# Patient Record
Sex: Female | Born: 1965 | Race: Black or African American | Hispanic: No | Marital: Married | Smoking: Never smoker
Health system: Southern US, Community
[De-identification: ages and names within clinical notes are randomized; demographics above are authoritative.]

## PROBLEM LIST (undated history)

## (undated) DIAGNOSIS — E039 Hypothyroidism, unspecified: Secondary | ICD-10-CM

## (undated) DIAGNOSIS — E559 Vitamin D deficiency, unspecified: Secondary | ICD-10-CM

## (undated) DIAGNOSIS — R51 Headache: Secondary | ICD-10-CM

## (undated) DIAGNOSIS — L80 Vitiligo: Secondary | ICD-10-CM

## (undated) HISTORY — PX: DILATION AND CURETTAGE OF UTERUS: SHX78

## (undated) HISTORY — DX: Vitamin D deficiency, unspecified: E55.9

## (undated) HISTORY — DX: Vitiligo: L80

---

## 2000-08-03 ENCOUNTER — Other Ambulatory Visit: Admission: RE | Admit: 2000-08-03 | Discharge: 2000-08-03 | Payer: Self-pay | Admitting: Obstetrics and Gynecology

## 2003-04-23 ENCOUNTER — Emergency Department (HOSPITAL_COMMUNITY): Admission: EM | Admit: 2003-04-23 | Discharge: 2003-04-23 | Payer: Self-pay | Admitting: Emergency Medicine

## 2004-11-03 ENCOUNTER — Ambulatory Visit (HOSPITAL_COMMUNITY): Admission: RE | Admit: 2004-11-03 | Discharge: 2004-11-03 | Payer: Self-pay | Admitting: Family Medicine

## 2006-03-27 ENCOUNTER — Ambulatory Visit (HOSPITAL_COMMUNITY): Admission: RE | Admit: 2006-03-27 | Discharge: 2006-03-27 | Payer: Self-pay | Admitting: Family Medicine

## 2007-05-01 ENCOUNTER — Ambulatory Visit (HOSPITAL_COMMUNITY): Admission: RE | Admit: 2007-05-01 | Discharge: 2007-05-01 | Payer: Self-pay | Admitting: Family Medicine

## 2009-05-04 ENCOUNTER — Ambulatory Visit (HOSPITAL_COMMUNITY): Admission: RE | Admit: 2009-05-04 | Discharge: 2009-05-04 | Payer: Self-pay | Admitting: Family Medicine

## 2010-05-25 ENCOUNTER — Other Ambulatory Visit: Payer: Self-pay | Admitting: Family Medicine

## 2010-05-25 DIAGNOSIS — Z139 Encounter for screening, unspecified: Secondary | ICD-10-CM

## 2010-06-08 ENCOUNTER — Ambulatory Visit (HOSPITAL_COMMUNITY)
Admission: RE | Admit: 2010-06-08 | Discharge: 2010-06-08 | Disposition: A | Discharge status: Home / Self Care | Payer: 59 | Source: Ambulatory Visit | Attending: Family Medicine | Admitting: Family Medicine

## 2010-06-08 DIAGNOSIS — Z1231 Encounter for screening mammogram for malignant neoplasm of breast: Secondary | ICD-10-CM | POA: Insufficient documentation

## 2010-06-08 DIAGNOSIS — Z139 Encounter for screening, unspecified: Secondary | ICD-10-CM

## 2010-08-13 ENCOUNTER — Telehealth (INDEPENDENT_AMBULATORY_CARE_PROVIDER_SITE_OTHER): Payer: Self-pay | Admitting: *Deleted

## 2010-08-13 NOTE — Telephone Encounter (Signed)
TCS resch'd to 10/06/10 @ 1:45 (12:45), movi pre instructions mailed

## 2010-09-15 ENCOUNTER — Encounter (INDEPENDENT_AMBULATORY_CARE_PROVIDER_SITE_OTHER): Payer: 59 | Admitting: Internal Medicine

## 2010-10-05 MED ORDER — SODIUM CHLORIDE 0.45 % IV SOLN
Freq: Once | INTRAVENOUS | Status: AC
  Administered 2010-10-06: 13:00:00 via INTRAVENOUS

## 2010-10-06 ENCOUNTER — Encounter (HOSPITAL_COMMUNITY): Payer: Self-pay | Admitting: *Deleted

## 2010-10-06 ENCOUNTER — Encounter (HOSPITAL_COMMUNITY)
Admission: RE | Disposition: A | Discharge status: Home / Self Care | Payer: Self-pay | Source: Ambulatory Visit | Attending: Internal Medicine

## 2010-10-06 ENCOUNTER — Ambulatory Visit (HOSPITAL_COMMUNITY)
Admission: RE | Admit: 2010-10-06 | Discharge: 2010-10-06 | Disposition: A | Discharge status: Home / Self Care | Payer: 59 | Source: Ambulatory Visit | Attending: Internal Medicine | Admitting: Internal Medicine

## 2010-10-06 ENCOUNTER — Other Ambulatory Visit (INDEPENDENT_AMBULATORY_CARE_PROVIDER_SITE_OTHER): Payer: Self-pay | Admitting: Internal Medicine

## 2010-10-06 DIAGNOSIS — Z800 Family history of malignant neoplasm of digestive organs: Secondary | ICD-10-CM

## 2010-10-06 DIAGNOSIS — D126 Benign neoplasm of colon, unspecified: Secondary | ICD-10-CM

## 2010-10-06 DIAGNOSIS — K644 Residual hemorrhoidal skin tags: Secondary | ICD-10-CM

## 2010-10-06 DIAGNOSIS — K648 Other hemorrhoids: Secondary | ICD-10-CM | POA: Insufficient documentation

## 2010-10-06 DIAGNOSIS — Z1211 Encounter for screening for malignant neoplasm of colon: Secondary | ICD-10-CM | POA: Insufficient documentation

## 2010-10-06 HISTORY — DX: Headache: R51

## 2010-10-06 HISTORY — DX: Hypothyroidism, unspecified: E03.9

## 2010-10-06 HISTORY — PX: COLONOSCOPY: SHX5424

## 2010-10-06 SURGERY — COLONOSCOPY
Anesthesia: Moderate Sedation

## 2010-10-06 MED ORDER — MEPERIDINE HCL 50 MG/ML IJ SOLN
INTRAMUSCULAR | Status: DC | PRN
  Administered 2010-10-06 (×2): 25 mg via INTRAVENOUS

## 2010-10-06 MED ORDER — MIDAZOLAM HCL 5 MG/5ML IJ SOLN
INTRAMUSCULAR | Status: AC
  Filled 2010-10-06: qty 10

## 2010-10-06 MED ORDER — MIDAZOLAM HCL 5 MG/5ML IJ SOLN
INTRAMUSCULAR | Status: DC | PRN
  Administered 2010-10-06: 2 mg via INTRAVENOUS
  Administered 2010-10-06: 1 mg via INTRAVENOUS
  Administered 2010-10-06: 2 mg via INTRAVENOUS

## 2010-10-06 MED ORDER — MEPERIDINE HCL 50 MG/ML IJ SOLN
INTRAMUSCULAR | Status: AC
  Filled 2010-10-06: qty 1

## 2010-10-06 NOTE — H&P (Signed)
Lisa Potter is an 45 y.o. female.   Chief Complaint: Patient is here for colonoscopy. HPI: [name] is 45 year old American female whose twin sister was diagnosed with metastatic colon carcinoma earlier this year. She is therefore here for high-risk screening colonoscopy. She denies abdominal pain rectal bleeding or change in her bowel habits. Family history is also significant for colon carcinoma and a maternal uncle who was in his 60,s at the time of diagnosis.  Past Medical History  Diagnosis Date  . Hypothyroidism   . Headache     Past Surgical History  Procedure Date  . Dilation and curettage of uterus     Family History  Problem Relation Age of Onset  . Colon cancer Sister    Social History:  reports that she has never smoked. She does not have any smokeless tobacco history on file. She reports that she does not drink alcohol or use illicit drugs.  Allergies: No Known Allergies  Medications Prior to Admission  Medication Dose Route Frequency Provider Last Rate Last Dose  . 0.45 % sodium chloride infusion   Intravenous Once Lisa Hippo, MD 20 mL/hr at 10/06/10 1259    . meperidine (DEMEROL) 50 MG/ML injection           . midazolam (VERSED) 5 MG/5ML injection            Medications Prior to Admission  Medication Sig Dispense Refill  . Calcium Carbonate-Vitamin Lisa (CALCIUM-VITAMIN Lisa) 500-200 MG-UNIT per tablet Take 1 tablet by mouth daily.        Marland Kitchen levothyroxine (SYNTHROID, LEVOTHROID) 25 MCG tablet Take 25 mcg by mouth daily.        . vitamin E 200 UNIT capsule Take 200 Units by mouth daily.        Marland Kitchen acetaminophen (TYLENOL) 500 MG tablet Take 500 mg by mouth every 6 (six) hours as needed. For pain       . calcium carbonate (OS-CAL) 600 MG TABS Take 600 mg by mouth daily.          No results found for this or any previous visit (from the past 48 hour(s)). No results found.  Review of Systems  Constitutional: Negative for weight loss.  Gastrointestinal: Negative for  abdominal pain, diarrhea, constipation, blood in stool and melena.    Blood pressure 110/69, pulse 78, temperature 98.7 F (37.1 C), temperature source Oral, resp. rate 18, height 5\' 3"  (1.6 m), weight 148 lb (67.132 kg), last menstrual period 10/05/2010, SpO2 100.00%. Physical Exam  Constitutional: She appears well-developed and well-nourished.  HENT:  Mouth/Throat: Oropharynx is clear and moist.  Eyes: Conjunctivae are normal. No scleral icterus.  Neck: No thyromegaly present.  Cardiovascular: Normal rate, regular rhythm and normal heart sounds.   No murmur heard. Respiratory: Effort normal and breath sounds normal.  GI: Soft. She exhibits no distension and no mass. There is no tenderness.  Musculoskeletal: She exhibits no edema.  Lymphadenopathy:    She has no cervical adenopathy.  Skin: Skin is warm and dry.     Assessment/Plan High-risk screening colonoscopy. Patient's twin sister was recently diagnosed with metastatic colon carcinoma.  Lisa Potter U 10/06/2010, 1:34 PM

## 2010-10-06 NOTE — Op Note (Signed)
COLONOSCOPY PROCEDURE REPORT  PATIENT:  Lisa Potter  MR#:  [id number] Birthdate:  Oct 05, 1965, 45 y.o., female Endoscopist:  Dr. Malissa Hippo, MD Referred By:  Dr. Lubertha South, MD Procedure Date: 10/06/2010  Procedure:   Colonoscopy with snare polypectomy  Indications:  Patient is 45 year old female who is undergoing high-risk screening colonoscopy. Her twin sister was recently diagnosed with metastatic colon carcinoma  Informed Consent:  Procedure and risks were reviewed with the patient and informed consent was obtained.  Medications:  Demerol 50 mg IV Versed 5 mg IV  Description of procedure:  After a digital rectal exam was performed, that colonoscope was advanced from the anus through the rectum and colon to the area of the cecum, ileocecal valve and appendiceal orifice. The cecum was deeply intubated. These structures were well-seen and photographed for the record. From the level of the cecum and ileocecal valve, the scope was slowly and cautiously withdrawn. The mucosal surfaces were carefully surveyed utilizing scope tip to flexion to facilitate fold flattening as needed. The scope was pulled down into the rectum where a thorough exam including retroflexion was performed. Terminal ileum was also examined.  Findings:   Prep excellent. Normal terminal ileum. 5-6 mm polyp snared from hepatic flexure. 2 tiny polyps at splenic flexure which were coagulated using snare tip. Small hemorrhoids below the dentate line.  Therapeutic/Diagnostic Maneuvers Performed:  See above  Complications:  None  Cecal Withdrawal Time:  14 minutes  Impression:  Normal terminal ileum. 5-6 mm polyp snared from hepatic flexure. 2 tiny polyps at splenic flexure were coagulated using snare tip. Small external hemorrhoids  Recommendations:  No aspirin for one week I will be contacting patient with results of biopsy. Next colonoscopy in 5 years  Lisa Potter  10/06/2010 2:11 PM  CC: Dr.  Harlow Asa, MD, MD & Dr. Bonnetta Barry ref. provider found

## 2010-10-13 ENCOUNTER — Encounter (INDEPENDENT_AMBULATORY_CARE_PROVIDER_SITE_OTHER): Payer: Self-pay | Admitting: *Deleted

## 2010-10-13 ENCOUNTER — Encounter (HOSPITAL_COMMUNITY): Payer: Self-pay | Admitting: Internal Medicine

## 2011-06-07 ENCOUNTER — Other Ambulatory Visit: Payer: Self-pay | Admitting: Family Medicine

## 2011-06-07 DIAGNOSIS — Z139 Encounter for screening, unspecified: Secondary | ICD-10-CM

## 2011-06-20 ENCOUNTER — Ambulatory Visit (HOSPITAL_COMMUNITY)
Admission: RE | Admit: 2011-06-20 | Discharge: 2011-06-20 | Disposition: A | Discharge status: Home / Self Care | Payer: 59 | Source: Ambulatory Visit | Attending: Family Medicine | Admitting: Family Medicine

## 2011-06-20 DIAGNOSIS — Z139 Encounter for screening, unspecified: Secondary | ICD-10-CM

## 2011-06-20 DIAGNOSIS — Z1231 Encounter for screening mammogram for malignant neoplasm of breast: Secondary | ICD-10-CM | POA: Insufficient documentation

## 2012-03-24 ENCOUNTER — Other Ambulatory Visit: Payer: Self-pay | Admitting: *Deleted

## 2012-03-24 MED ORDER — LEVOTHYROXINE SODIUM 50 MCG PO TABS: 50.0000 ug | ORAL_TABLET | Freq: Every day | ORAL | Status: DC

## 2012-04-18 ENCOUNTER — Other Ambulatory Visit: Payer: Self-pay | Admitting: Nurse Practitioner

## 2012-05-16 ENCOUNTER — Other Ambulatory Visit: Payer: Self-pay | Admitting: Nurse Practitioner

## 2012-06-15 ENCOUNTER — Other Ambulatory Visit: Payer: Self-pay

## 2012-06-15 MED ORDER — NORGESTIMATE-ETH ESTRADIOL 0.25-35 MG-MCG PO TABS: ORAL_TABLET | ORAL | Status: DC

## 2012-07-02 ENCOUNTER — Other Ambulatory Visit: Payer: Self-pay | Admitting: Family Medicine

## 2012-07-02 DIAGNOSIS — Z09 Encounter for follow-up examination after completed treatment for conditions other than malignant neoplasm: Secondary | ICD-10-CM

## 2012-07-13 ENCOUNTER — Encounter: Payer: Self-pay | Admitting: *Deleted

## 2012-07-16 ENCOUNTER — Ambulatory Visit (HOSPITAL_COMMUNITY): Payer: 59

## 2012-07-17 ENCOUNTER — Encounter: Payer: Self-pay | Admitting: Nurse Practitioner

## 2012-07-17 ENCOUNTER — Ambulatory Visit (HOSPITAL_COMMUNITY)
Admission: RE | Admit: 2012-07-17 | Discharge: 2012-07-17 | Disposition: A | Discharge status: Home / Self Care | Payer: 59 | Source: Ambulatory Visit | Attending: Family Medicine | Admitting: Family Medicine

## 2012-07-17 ENCOUNTER — Ambulatory Visit (INDEPENDENT_AMBULATORY_CARE_PROVIDER_SITE_OTHER): Payer: 59 | Admitting: Nurse Practitioner

## 2012-07-17 VITALS — BP 120/86 | Ht 63.25 in | Wt 158.8 lb

## 2012-07-17 DIAGNOSIS — B070 Plantar wart: Secondary | ICD-10-CM

## 2012-07-17 DIAGNOSIS — Z01419 Encounter for gynecological examination (general) (routine) without abnormal findings: Secondary | ICD-10-CM

## 2012-07-17 DIAGNOSIS — E039 Hypothyroidism, unspecified: Secondary | ICD-10-CM

## 2012-07-17 DIAGNOSIS — L408 Other psoriasis: Secondary | ICD-10-CM

## 2012-07-17 DIAGNOSIS — Z1231 Encounter for screening mammogram for malignant neoplasm of breast: Secondary | ICD-10-CM | POA: Insufficient documentation

## 2012-07-17 DIAGNOSIS — Z09 Encounter for follow-up examination after completed treatment for conditions other than malignant neoplasm: Secondary | ICD-10-CM

## 2012-07-17 DIAGNOSIS — N920 Excessive and frequent menstruation with regular cycle: Secondary | ICD-10-CM

## 2012-07-17 DIAGNOSIS — L409 Psoriasis, unspecified: Secondary | ICD-10-CM

## 2012-07-17 DIAGNOSIS — Z0000 Encounter for general adult medical examination without abnormal findings: Secondary | ICD-10-CM

## 2012-07-17 LAB — CBC WITH DIFFERENTIAL/PLATELET
Basophils Absolute: 0 10*3/uL (ref 0.0–0.1)
Basophils Relative: 0 % (ref 0–1)
Eosinophils Relative: 2 % (ref 0–5)
HCT: 42.1 % (ref 36.0–46.0)
MCHC: 34.4 g/dL (ref 30.0–36.0)
Monocytes Absolute: 0.4 10*3/uL (ref 0.1–1.0)
Neutro Abs: 5 10*3/uL (ref 1.7–7.7)
RDW: 13.4 % (ref 11.5–15.5)

## 2012-07-17 LAB — HEPATIC FUNCTION PANEL
AST: 31 U/L (ref 0–37)
Alkaline Phosphatase: 87 U/L (ref 39–117)
Bilirubin, Direct: 0.1 mg/dL (ref 0.0–0.3)
Indirect Bilirubin: 0.5 mg/dL (ref 0.0–0.9)
Total Bilirubin: 0.6 mg/dL (ref 0.3–1.2)

## 2012-07-17 LAB — LIPID PANEL
Cholesterol: 209 mg/dL — ABNORMAL HIGH (ref 0–200)
LDL Cholesterol: 116 mg/dL — ABNORMAL HIGH (ref 0–99)
Total CHOL/HDL Ratio: 3.1 Ratio
VLDL: 25 mg/dL (ref 0–40)

## 2012-07-17 LAB — BASIC METABOLIC PANEL
BUN: 10 mg/dL (ref 6–23)
Potassium: 4.3 mEq/L (ref 3.5–5.3)
Sodium: 139 mEq/L (ref 135–145)

## 2012-07-17 MED ORDER — NORGESTIMATE-ETH ESTRADIOL 0.25-35 MG-MCG PO TABS: ORAL_TABLET | ORAL | Status: DC

## 2012-07-17 NOTE — Progress Notes (Signed)
  Subjective:    Patient ID: Lisa Potter, female    DOB: 02-18-1965, 47 y.o.   MRN: [id number]  HPI  presents for her wellness physical. Married, same sexual partner. No pelvic pain. No discharge. Cycles are regular, slightly heavy. Continues to take birth control pills. Discussed potential adverse effects associated with OC use. Patient wishes to continue for now. Taking vitamin D 2000 units per day. Continues to have some psoriasis on her left palm wrist and nailbed. Has seen Dr. Margo Aye for this. The cream he prescribed is not working. Gets regular eye exams. Is due a dental exam. Scheduled for a mammogram today. Also complaints of sore area on the sole of her right foot that is been there for while. Very painful a time when she walks.    Review of Systems  Constitutional: Negative for fever, activity change, appetite change and fatigue.  HENT: Negative for hearing loss, sore throat, rhinorrhea, dental problem, postnasal drip, sinus pressure and ear discharge.   Eyes: Negative for visual disturbance.  Respiratory: Negative for cough, chest tightness, shortness of breath and wheezing.   Cardiovascular: Negative for chest pain.  Gastrointestinal: Negative for nausea, vomiting, abdominal pain, diarrhea, constipation, blood in stool and abdominal distention.  Genitourinary: Negative for dysuria, urgency, frequency, vaginal discharge, enuresis, difficulty urinating, menstrual problem and pelvic pain.  Skin: Positive for rash.  Neurological: Negative for headaches.  Psychiatric/Behavioral: Negative for sleep disturbance and dysphoric mood. The patient is not nervous/anxious.        Objective:   Physical Exam  Vitals reviewed. Constitutional: She is oriented to person, place, and time. She appears well-developed. No distress.  HENT:  Right Ear: External ear normal.  Left Ear: External ear normal.  Mouth/Throat: Oropharynx is clear and moist.  Neck: Normal range of motion. Neck supple. No  tracheal deviation present. No thyromegaly present.  Cardiovascular: Normal rate, regular rhythm and normal heart sounds.  Exam reveals no gallop.   No murmur heard. Pulmonary/Chest: Effort normal and breath sounds normal.  Abdominal: Soft. She exhibits no distension and no mass. There is no tenderness.  Genitourinary: Vagina normal and uterus normal. No vaginal discharge found.  Musculoskeletal: She exhibits no edema.  Lymphadenopathy:    She has no cervical adenopathy.  Neurological: She is alert and oriented to person, place, and time.  Skin: Skin is warm and dry. Rash noted.  Psychiatric: She has a normal mood and affect. Her behavior is normal.   Skin: Hypopigmentation and scaly dry skin noted along the thumb with disfiguration of the left thumb nailbed. A fading hyperpigmented areas noted on the left wrist. A small firm nodule noted in the sole of the right foot with a tiny pinpoint center noted. Breast exam no masses, axilla no adenopathy. External GU normal limit. Bimanual exam normal.        Assessment & Plan:  Well woman exam  Routine general medical examination at a health care facility - Plan: Basic metabolic panel, Hepatic function panel, Lipid panel, Vitamin D 25 hydroxy, Basic metabolic panel, Hepatic function panel, Lipid panel, Vitamin D 25 hydroxy  Hypothyroidism - Plan: TSH, TSH  Menorrhagia - Plan: CBC with Differential, CBC with Differential  Follow up - Plan: MM Digital Screening  Psoriasis  Plantar wart of right foot  Follow-up with dermatologist for psoriasis and plantar wart. Recommend dental exam. Continue calcium and vitamin D. Refill Sprintec x1 year. Again discussed risks associated with OC use. Next physical in one year.

## 2012-07-18 ENCOUNTER — Encounter: Payer: Self-pay | Admitting: Nurse Practitioner

## 2012-07-18 DIAGNOSIS — E039 Hypothyroidism, unspecified: Secondary | ICD-10-CM | POA: Insufficient documentation

## 2012-07-18 DIAGNOSIS — L409 Psoriasis, unspecified: Secondary | ICD-10-CM | POA: Insufficient documentation

## 2012-07-18 LAB — VITAMIN D 25 HYDROXY (VIT D DEFICIENCY, FRACTURES): Vit D, 25-Hydroxy: 57 ng/mL (ref 30–89)

## 2012-07-18 MED ORDER — LEVOTHYROXINE SODIUM 50 MCG PO TABS: 50.0000 ug | ORAL_TABLET | Freq: Every day | ORAL | Status: DC

## 2012-07-18 NOTE — Assessment & Plan Note (Signed)
TSH normal, continue same dose of levothyroxine

## 2012-07-18 NOTE — Assessment & Plan Note (Signed)
Follow up with dermatologist

## 2013-07-11 ENCOUNTER — Other Ambulatory Visit: Payer: Self-pay | Admitting: Nurse Practitioner

## 2013-07-18 ENCOUNTER — Other Ambulatory Visit: Payer: Self-pay | Admitting: Nurse Practitioner

## 2013-07-19 ENCOUNTER — Other Ambulatory Visit: Payer: Self-pay | Admitting: *Deleted

## 2013-08-09 ENCOUNTER — Ambulatory Visit (INDEPENDENT_AMBULATORY_CARE_PROVIDER_SITE_OTHER): Payer: BC Managed Care – PPO | Admitting: Nurse Practitioner

## 2013-08-09 ENCOUNTER — Encounter: Payer: Self-pay | Admitting: Nurse Practitioner

## 2013-08-09 VITALS — BP 128/88 | Ht 63.25 in | Wt 160.0 lb

## 2013-08-09 DIAGNOSIS — Z0000 Encounter for general adult medical examination without abnormal findings: Secondary | ICD-10-CM

## 2013-08-09 DIAGNOSIS — E039 Hypothyroidism, unspecified: Secondary | ICD-10-CM

## 2013-08-09 MED ORDER — LEVOTHYROXINE SODIUM 50 MCG PO TABS
ORAL_TABLET | ORAL | Status: DC
Start: 2013-08-09 — End: 2014-08-08

## 2013-08-09 MED ORDER — NORGESTIMATE-ETH ESTRADIOL 0.25-35 MG-MCG PO TABS: ORAL_TABLET | ORAL | Status: DC

## 2013-08-10 LAB — TSH: TSH: 4.185 u[IU]/mL (ref 0.350–4.500)

## 2013-08-12 ENCOUNTER — Encounter: Payer: Self-pay | Admitting: Nurse Practitioner

## 2013-08-12 NOTE — Progress Notes (Signed)
   Subjective:    Patient ID: ALISHBA NAPLES, female    DOB: 05-Sep-1965, 48 y.o.   MRN: [id number]  HPI Presents for her wellness exam. Married, same sexual partner. Regular vision and dental exams. Regular menstrual cycles, normal flow, start her cycle yesterday. Healthy diet. Stays active.    Review of Systems  Constitutional: Negative for activity change, appetite change and fatigue.  HENT: Negative for dental problem, ear pain, sinus pressure and sore throat.   Respiratory: Negative for cough, chest tightness, shortness of breath and wheezing.   Cardiovascular: Negative for chest pain.  Gastrointestinal: Negative for nausea, vomiting, abdominal pain, diarrhea, constipation, blood in stool and abdominal distention.  Genitourinary: Positive for vaginal bleeding. Negative for dysuria, urgency, frequency, vaginal discharge, difficulty urinating, genital sores, menstrual problem and pelvic pain.       Objective:   Physical Exam  Vitals reviewed. Constitutional: She is oriented to person, place, and time. She appears well-developed. No distress.  HENT:  Right Ear: External ear normal.  Left Ear: External ear normal.  Mouth/Throat: Oropharynx is clear and moist.  Neck: Normal range of motion. Neck supple. No tracheal deviation present. No thyromegaly present.  Cardiovascular: Normal rate, regular rhythm and normal heart sounds.  Exam reveals no gallop.   No murmur heard. Pulmonary/Chest: Effort normal and breath sounds normal.  Abdominal: Soft. She exhibits no distension. There is no tenderness.  Genitourinary: Vagina normal and uterus normal. No vaginal discharge found.  GU exam deferred due to patient's menses.  Musculoskeletal: She exhibits no edema.  Lymphadenopathy:    She has no cervical adenopathy.  Neurological: She is alert and oriented to person, place, and time.  Skin: Skin is warm and dry. No rash noted.  Psychiatric: She has a normal mood and affect. Her behavior is  normal.   Breast exam: Mild fine nodularity, no masses. Axilla no adenopathy.        Assessment & Plan:   Problem List Items Addressed This Visit     Endocrine   Hypothyroidism   Relevant Medications      levothyroxine (SYNTHROID, LEVOTHROID) tablet   Other Relevant Orders      TSH (Completed)    Other Visit Diagnoses   Routine general medical examination at a health care facility    -  Primary      Meds ordered this encounter  Medications  . calcipotriene (DOVONOX) 0.005 % cream    Sig: Apply topically 2 (two) times daily.  . calcipotriene-betamethasone (TACLONEX) external suspension    Sig: Apply topically daily.  . Calcium Carbonate (CALCI-CHEW PO)    Sig: Take by mouth.  . norgestimate-ethinyl estradiol (SPRINTEC 28) 0.25-35 MG-MCG tablet    Sig: TAKE ONE TABLET BY MOUTH ONCE DAILY AS DIRECTED    Dispense:  28 tablet    Refill:  11    Order Specific Question:  Supervising Provider    Answer:  Mikey Kirschner [[id number]]  . levothyroxine (SYNTHROID, LEVOTHROID) 50 MCG tablet    Sig: TAKE 1 TABLET BY MOUTH DAILY    Dispense:  30 tablet    Refill:  11    Order Specific Question:  Supervising Provider    Answer:  Mikey Kirschner [[id number]]   Discussed risk associated with OC use. Patient wishes to continue for now. Nonsmoker. Recommend daily calcium and vitamin D supplementation. Regular exercise and healthy diet. Return in about 1 year (around 08/10/2014).

## 2013-08-22 ENCOUNTER — Other Ambulatory Visit: Payer: Self-pay | Admitting: Nurse Practitioner

## 2013-08-22 DIAGNOSIS — Z139 Encounter for screening, unspecified: Secondary | ICD-10-CM

## 2013-09-16 ENCOUNTER — Ambulatory Visit (HOSPITAL_COMMUNITY)
Admission: RE | Admit: 2013-09-16 | Discharge: 2013-09-16 | Disposition: A | Discharge status: Home / Self Care | Payer: BC Managed Care – PPO | Source: Ambulatory Visit | Attending: Nurse Practitioner | Admitting: Nurse Practitioner

## 2013-09-16 DIAGNOSIS — Z1231 Encounter for screening mammogram for malignant neoplasm of breast: Secondary | ICD-10-CM | POA: Insufficient documentation

## 2013-09-16 DIAGNOSIS — Z139 Encounter for screening, unspecified: Secondary | ICD-10-CM

## 2014-08-08 ENCOUNTER — Other Ambulatory Visit: Payer: Self-pay | Admitting: Nurse Practitioner

## 2014-08-12 ENCOUNTER — Encounter: Payer: Self-pay | Admitting: Nurse Practitioner

## 2014-08-12 ENCOUNTER — Ambulatory Visit (INDEPENDENT_AMBULATORY_CARE_PROVIDER_SITE_OTHER): Payer: BLUE CROSS/BLUE SHIELD | Admitting: Nurse Practitioner

## 2014-08-12 VITALS — BP 130/80 | Ht 63.0 in | Wt 160.4 lb

## 2014-08-12 DIAGNOSIS — Z1151 Encounter for screening for human papillomavirus (HPV): Secondary | ICD-10-CM | POA: Diagnosis not present

## 2014-08-12 DIAGNOSIS — N912 Amenorrhea, unspecified: Secondary | ICD-10-CM

## 2014-08-12 DIAGNOSIS — Z124 Encounter for screening for malignant neoplasm of cervix: Secondary | ICD-10-CM | POA: Diagnosis not present

## 2014-08-12 DIAGNOSIS — Z0000 Encounter for general adult medical examination without abnormal findings: Secondary | ICD-10-CM

## 2014-08-12 DIAGNOSIS — E039 Hypothyroidism, unspecified: Secondary | ICD-10-CM

## 2014-08-12 DIAGNOSIS — L409 Psoriasis, unspecified: Secondary | ICD-10-CM | POA: Diagnosis not present

## 2014-08-12 MED ORDER — BETAMETHASONE DIPROPIONATE 0.05 % EX CREA: TOPICAL_CREAM | Freq: Two times a day (BID) | CUTANEOUS | Status: DC

## 2014-08-12 MED ORDER — CALCIPOTRIENE 0.005 % EX CREA: TOPICAL_CREAM | Freq: Two times a day (BID) | CUTANEOUS | Status: DC

## 2014-08-12 NOTE — Progress Notes (Signed)
Subjective:    Patient ID: Lisa Potter, female    DOB: 12/05/1965, 49 y.o.   MRN: [id number]  HPI presents for her wellness physical. Same sexual partner. Has been off her birth control for the past 2 months, no cycle. Regular vision and dental exams. Request refills on her topical medicines for her psoriasis. Regular exercise. Overall healthy diet. Has been working on her weight loss. Very active job. Will have her regular lab work done at work in November. Plans to schedule her own mammogram in September.    Review of Systems  Constitutional: Negative for activity change, appetite change and fatigue.  HENT: Negative for dental problem, ear pain, sinus pressure and sore throat.   Respiratory: Negative for cough, chest tightness, shortness of breath and wheezing.   Cardiovascular: Negative for chest pain.  Gastrointestinal: Negative for nausea, vomiting, abdominal pain, diarrhea, constipation and abdominal distention.  Genitourinary: Negative for dysuria, urgency, frequency, vaginal bleeding, vaginal discharge, enuresis, difficulty urinating, genital sores and pelvic pain.       Objective:   Physical Exam  Constitutional: She is oriented to person, place, and time. She appears well-developed. No distress.  HENT:  Right Ear: External ear normal.  Left Ear: External ear normal.  Mouth/Throat: Oropharynx is clear and moist.  Neck: Normal range of motion. Neck supple. No tracheal deviation present. No thyromegaly present.  Cardiovascular: Normal rate, regular rhythm and normal heart sounds.  Exam reveals no gallop.   No murmur heard. Pulmonary/Chest: Effort normal and breath sounds normal.  Abdominal: Soft. She exhibits no distension. There is no tenderness.  Genitourinary: Vagina normal and uterus normal. No vaginal discharge found.  External GU no rashes or lesions. Vagina no discharge. Cervix normal limit in appearance. No CMT. Bimanual exam no tenderness or obvious masses.    Musculoskeletal: She exhibits no edema.  Lymphadenopathy:    She has no cervical adenopathy.  Neurological: She is alert and oriented to person, place, and time.  Skin: Skin is warm and dry. No rash noted.  Psychiatric: She has a normal mood and affect. Her behavior is normal.  Vitals reviewed.  Breast exam: Scattered areas of density, no dominant masses. Axilla no adenopathy.        Assessment & Plan:   Problem List Items Addressed This Visit      Endocrine   Hypothyroidism   Relevant Orders   TSH     Musculoskeletal and Integument   Psoriasis    Other Visit Diagnoses    Routine general medical examination at a health care facility    -  Primary    Amenorrhea        Relevant Orders    FSH    Estradiol    Screening for cervical cancer        Relevant Orders    Pap IG and HPV (high risk) DNA detection    Screening for HPV (human papillomavirus)        Relevant Orders    Pap IG and HPV (high risk) DNA detection      Meds ordered this encounter  Medications  . betamethasone dipropionate (DIPROLENE) 0.05 % cream    Sig: Apply topically 2 (two) times daily. Prn up to 2 weeks at a time    Dispense:  45 g    Refill:  0    Order Specific Question:  Supervising Provider    Answer:  Mikey Kirschner [[id number]]  . calcipotriene (DOVONOX) 0.005 % cream  Sig: Apply topically 2 (two) times daily. Prn psoriasis; do not apply to face    Dispense:  60 g    Refill:  0    Order Specific Question:  Supervising Provider    Answer:  Mikey Kirschner [[id number]]   Further follow-up based on [facility] and estradiol levels. Continue daily vitamin D and calcium supplementation. Send copy of lab work to our office in November. Return in about 1 year (around 08/12/2015) for physical.

## 2014-08-14 LAB — PAP IG AND HPV HIGH-RISK
HPV, high-risk: NEGATIVE
PAP Smear Comment: 0

## 2014-08-18 ENCOUNTER — Other Ambulatory Visit: Payer: Self-pay

## 2014-08-18 LAB — ESTRADIOL, FREE
ESTRADIOL: 9.2 pg/mL
Free Estradiol, Percent: 1.2 %
Free Estradiol, Serum: 0.11 pg/mL

## 2014-08-18 LAB — TSH: TSH: 1.28 u[IU]/mL (ref 0.450–4.500)

## 2014-08-18 LAB — FOLLICLE STIMULATING HORMONE: FSH: 99.9 m[IU]/mL

## 2014-08-18 MED ORDER — LEVOTHYROXINE SODIUM 50 MCG PO TABS: 50.0000 ug | ORAL_TABLET | Freq: Every day | ORAL | Status: DC

## 2014-09-18 ENCOUNTER — Other Ambulatory Visit: Payer: Self-pay | Admitting: Nurse Practitioner

## 2014-09-18 DIAGNOSIS — Z1231 Encounter for screening mammogram for malignant neoplasm of breast: Secondary | ICD-10-CM

## 2014-10-13 ENCOUNTER — Ambulatory Visit (HOSPITAL_COMMUNITY)
Admission: RE | Admit: 2014-10-13 | Discharge: 2014-10-13 | Disposition: A | Discharge status: Home / Self Care | Payer: BLUE CROSS/BLUE SHIELD | Source: Ambulatory Visit | Attending: Nurse Practitioner | Admitting: Nurse Practitioner

## 2014-10-13 DIAGNOSIS — Z1231 Encounter for screening mammogram for malignant neoplasm of breast: Secondary | ICD-10-CM | POA: Insufficient documentation

## 2014-12-10 ENCOUNTER — Ambulatory Visit (INDEPENDENT_AMBULATORY_CARE_PROVIDER_SITE_OTHER): Payer: BLUE CROSS/BLUE SHIELD | Admitting: Nurse Practitioner

## 2014-12-10 ENCOUNTER — Encounter: Payer: Self-pay | Admitting: Nurse Practitioner

## 2014-12-10 VITALS — BP 122/82 | Ht 63.0 in | Wt 165.2 lb

## 2014-12-10 DIAGNOSIS — Z202 Contact with and (suspected) exposure to infections with a predominantly sexual mode of transmission: Secondary | ICD-10-CM | POA: Diagnosis not present

## 2014-12-10 DIAGNOSIS — R21 Rash and other nonspecific skin eruption: Secondary | ICD-10-CM

## 2014-12-10 MED ORDER — VALACYCLOVIR HCL 1 G PO TABS: 1000.0000 mg | ORAL_TABLET | Freq: Two times a day (BID) | ORAL | Status: DC

## 2014-12-11 ENCOUNTER — Encounter: Payer: Self-pay | Admitting: Nurse Practitioner

## 2014-12-11 NOTE — Progress Notes (Signed)
Subjective:  Presents for complaints of sudden onset rash in the right labial area that began 2 days ago. Slight pain in the area. External burning with urination. No fever. No urgency or frequency. No pelvic pain. Minimal vaginal discharge. Minimal odor. Has been married to the same partner for over 20 years. He is taking suppressive therapy for genital herpes which she was diagnosed with years ago. Patient has never had any outbreaks.  Objective:   BP 122/82 mmHg  Ht 5\' 3"  (1.6 m)  Wt 165 lb 3.2 oz (74.934 kg)  BMI 29.27 kg/m2  LMP 09/05/2013 NAD. Alert, oriented. Lungs clear. Heart regular rate rhythm. No pelvic tenderness on exam. External GU right labia is mildly edematous with a raised cluster of white confluent lesions on the mid to lower right labia. Several small white papules noted above and below this. Labia is minimally tender to palpation. No lesions are noted on the left side. Vagina very minimal white to clear mucoid discharge noted. Not even enough discharge for wet prep. Cervix normal limit in appearance. No CMT. Bimanual exam no tenderness or masses noted.  Assessment: Vulvar rash - Plan: Herpes simplex virus culture  Exposure to genital herpes - Plan: Herpes simplex virus culture  Plan:  Meds ordered this encounter  Medications  . valACYclovir (VALTREX) 1000 MG tablet    Sig: Take 1 tablet (1,000 mg total) by mouth 2 (two) times daily.    Dispense:  20 tablet    Refill:  0    Order Specific Question:  Supervising Provider    Answer:  Mikey Kirschner [[id number]]   Herpes culture pending. In the meantime start Valtrex as directed. Further follow-up based on culture results.

## 2014-12-13 LAB — HERPES SIMPLEX VIRUS CULTURE

## 2015-02-23 ENCOUNTER — Other Ambulatory Visit: Payer: Self-pay | Admitting: Family Medicine

## 2015-03-26 ENCOUNTER — Other Ambulatory Visit: Payer: Self-pay | Admitting: Family Medicine

## 2015-04-23 ENCOUNTER — Other Ambulatory Visit: Payer: Self-pay | Admitting: Family Medicine

## 2015-05-22 ENCOUNTER — Other Ambulatory Visit: Payer: Self-pay | Admitting: Family Medicine

## 2015-07-23 ENCOUNTER — Other Ambulatory Visit (INDEPENDENT_AMBULATORY_CARE_PROVIDER_SITE_OTHER): Payer: Self-pay | Admitting: *Deleted

## 2015-07-23 DIAGNOSIS — Z86010 Personal history of colonic polyps: Secondary | ICD-10-CM

## 2015-08-13 ENCOUNTER — Ambulatory Visit (INDEPENDENT_AMBULATORY_CARE_PROVIDER_SITE_OTHER): Payer: BLUE CROSS/BLUE SHIELD | Admitting: Nurse Practitioner

## 2015-08-13 ENCOUNTER — Encounter: Payer: Self-pay | Admitting: Nurse Practitioner

## 2015-08-13 VITALS — BP 126/80 | Ht 63.0 in | Wt 163.4 lb

## 2015-08-13 DIAGNOSIS — E559 Vitamin D deficiency, unspecified: Secondary | ICD-10-CM | POA: Insufficient documentation

## 2015-08-13 DIAGNOSIS — E039 Hypothyroidism, unspecified: Secondary | ICD-10-CM

## 2015-08-13 DIAGNOSIS — Z0000 Encounter for general adult medical examination without abnormal findings: Secondary | ICD-10-CM | POA: Diagnosis not present

## 2015-08-13 DIAGNOSIS — Z01419 Encounter for gynecological examination (general) (routine) without abnormal findings: Secondary | ICD-10-CM

## 2015-08-13 NOTE — Progress Notes (Signed)
   Subjective:    Patient ID: Lisa Potter, female    DOB: 03-Oct-1965, 50 y.o.   MRN: [id number]  HPI presents for her wellness exam. No bleeding or pelvic pain. Same sexual partner. Healthy diet. Regular vision and dental exams. Taking 2000 units of vitamin D daily. Is scheduled for repeat colonoscopy on 28-Oct-2022, her twin sister died of colon cancer 4 years ago. Regular walking for activity.    Review of Systems  Constitutional: Negative for activity change, appetite change, fatigue and fever.  HENT: Negative for dental problem, ear pain, sinus pressure and sore throat.   Respiratory: Negative for cough, chest tightness, shortness of breath and wheezing.   Cardiovascular: Negative for chest pain.  Gastrointestinal: Negative for abdominal distention, abdominal pain, constipation, diarrhea, nausea and vomiting.  Genitourinary: Negative for difficulty urinating, dysuria, enuresis, frequency, genital sores, pelvic pain, urgency, vaginal bleeding and vaginal discharge.       Objective:   Physical Exam  Constitutional: She is oriented to person, place, and time. She appears well-developed. No distress.  HENT:  Right Ear: External ear normal.  Left Ear: External ear normal.  Mouth/Throat: Oropharynx is clear and moist.  Neck: Normal range of motion. Neck supple. No tracheal deviation present. No thyromegaly present.  Cardiovascular: Normal rate, regular rhythm and normal heart sounds.  Exam reveals no gallop.   No murmur heard. Pulmonary/Chest: Effort normal and breath sounds normal.  Abdominal: Soft. She exhibits no distension. There is no tenderness.  Genitourinary: Vagina normal and uterus normal. No vaginal discharge found.  Genitourinary Comments: External GU: no rashes or lesions. Vagina: no discharge. Cervix normal in appearance; no CMT. Bimanual exam: no tenderness or obvious masses.   Musculoskeletal: She exhibits no edema.  Lymphadenopathy:    She has no cervical adenopathy.    Neurological: She is alert and oriented to person, place, and time.  Skin: Skin is warm and dry. No rash noted.  Psychiatric: She has a normal mood and affect. Her behavior is normal.  Vitals reviewed. Breast exam: areas of dense tissue; no masses; axillae no adenopathy.         Assessment & Plan:   Problem List Items Addressed This Visit      Endocrine   Hypothyroidism   Relevant Orders   TSH    Other Visit Diagnoses    Well woman exam    -  Primary   Relevant Orders   Lipid panel   Hepatic function panel   Basic metabolic panel   Vitamin D deficiency       Relevant Orders   VITAMIN D 25 Hydroxy (Vit-D Deficiency, Fractures)     Patient to schedule her own mammogram due in October.  Return in about 1 year (around 08/12/2016) for physical.

## 2015-08-14 LAB — HEPATIC FUNCTION PANEL
ALT: 19 IU/L (ref 0–32)
AST: 31 IU/L (ref 0–40)
Albumin: 4.6 g/dL (ref 3.5–5.5)
Alkaline Phosphatase: 113 IU/L (ref 39–117)
BILIRUBIN TOTAL: 0.5 mg/dL (ref 0.0–1.2)
Bilirubin, Direct: 0.12 mg/dL (ref 0.00–0.40)
TOTAL PROTEIN: 8.1 g/dL (ref 6.0–8.5)

## 2015-08-14 LAB — BASIC METABOLIC PANEL
BUN / CREAT RATIO: 11 (ref 9–23)
BUN: 10 mg/dL (ref 6–24)
CALCIUM: 9.6 mg/dL (ref 8.7–10.2)
CHLORIDE: 102 mmol/L (ref 96–106)
CO2: 25 mmol/L (ref 18–29)
Creatinine, Ser: 0.92 mg/dL (ref 0.57–1.00)
GFR calc Af Amer: 85 mL/min/{1.73_m2} (ref >59)
GFR, EST NON AFRICAN AMERICAN: 73 mL/min/{1.73_m2} (ref >59)
Glucose: 87 mg/dL (ref 65–99)
Potassium: 4 mmol/L (ref 3.5–5.2)
SODIUM: 143 mmol/L (ref 134–144)

## 2015-08-14 LAB — LIPID PANEL
Chol/HDL Ratio: 2.9 ratio units (ref 0.0–4.4)
Cholesterol, Total: 190 mg/dL (ref 100–199)
HDL: 66 mg/dL (ref >39)
LDL CALC: 108 mg/dL — AB (ref 0–99)
TRIGLYCERIDES: 79 mg/dL (ref 0–149)
VLDL Cholesterol Cal: 16 mg/dL (ref 5–40)

## 2015-08-14 LAB — TSH: TSH: 9.61 u[IU]/mL — ABNORMAL HIGH (ref 0.450–4.500)

## 2015-08-14 LAB — VITAMIN D 25 HYDROXY (VIT D DEFICIENCY, FRACTURES): Vit D, 25-Hydroxy: 37.5 ng/mL (ref 30.0–100.0)

## 2015-08-15 ENCOUNTER — Encounter: Payer: Self-pay | Admitting: Nurse Practitioner

## 2015-08-21 ENCOUNTER — Other Ambulatory Visit: Payer: Self-pay | Admitting: Family Medicine

## 2015-09-10 ENCOUNTER — Telehealth (INDEPENDENT_AMBULATORY_CARE_PROVIDER_SITE_OTHER): Payer: Self-pay | Admitting: *Deleted

## 2015-09-10 ENCOUNTER — Encounter (INDEPENDENT_AMBULATORY_CARE_PROVIDER_SITE_OTHER): Payer: Self-pay | Admitting: *Deleted

## 2015-09-10 NOTE — Telephone Encounter (Signed)
Referring MD/PCP: steve luking   Procedure: tcs  Reason/Indication:  Hx polyps, fam hx colon ca  Has patient had this procedure before?  Yes, 2012 -- epic  If so, when, by whom and where?    Is there a family history of colon cancer?  Yes, sister  Who?  What age when diagnosed?    Is patient diabetic?   no      Does patient have prosthetic heart valve or mechanical valve?  no  Do you have a pacemaker?  no  Has patient ever had endocarditis? no  Has patient had joint replacement within last 12 months?  no  Does patient tend to be constipated or take laxatives? no  Does patient have a history of alcohol/drug use?  no  Is patient on Coumadin, Plavix and/or Aspirin? no  Medications: see epic  Allergies: nkda  Medication Adjustment:   Procedure date & time: 10/07/15 at 100

## 2015-09-10 NOTE — Telephone Encounter (Signed)
Patient needs trilyte 

## 2015-09-11 MED ORDER — PEG 3350-KCL-NA BICARB-NACL 420 G PO SOLR: 4000.0000 mL | Freq: Once | ORAL | 0 refills | Status: AC

## 2015-09-11 NOTE — Telephone Encounter (Signed)
agree

## 2015-10-07 ENCOUNTER — Ambulatory Visit (HOSPITAL_COMMUNITY)
Admission: RE | Admit: 2015-10-07 | Discharge: 2015-10-07 | Disposition: A | Discharge status: Home / Self Care | Payer: BLUE CROSS/BLUE SHIELD | Source: Ambulatory Visit | Attending: Internal Medicine | Admitting: Internal Medicine

## 2015-10-07 ENCOUNTER — Encounter (HOSPITAL_COMMUNITY): Payer: Self-pay | Admitting: *Deleted

## 2015-10-07 ENCOUNTER — Encounter (HOSPITAL_COMMUNITY)
Admission: RE | Disposition: A | Discharge status: Home / Self Care | Payer: Self-pay | Source: Ambulatory Visit | Attending: Internal Medicine

## 2015-10-07 ENCOUNTER — Other Ambulatory Visit: Payer: Self-pay | Admitting: Nurse Practitioner

## 2015-10-07 DIAGNOSIS — Z8342 Family history of familial hypercholesterolemia: Secondary | ICD-10-CM | POA: Insufficient documentation

## 2015-10-07 DIAGNOSIS — Z09 Encounter for follow-up examination after completed treatment for conditions other than malignant neoplasm: Secondary | ICD-10-CM | POA: Diagnosis not present

## 2015-10-07 DIAGNOSIS — Z8249 Family history of ischemic heart disease and other diseases of the circulatory system: Secondary | ICD-10-CM | POA: Diagnosis not present

## 2015-10-07 DIAGNOSIS — K5730 Diverticulosis of large intestine without perforation or abscess without bleeding: Secondary | ICD-10-CM | POA: Insufficient documentation

## 2015-10-07 DIAGNOSIS — K6289 Other specified diseases of anus and rectum: Secondary | ICD-10-CM | POA: Insufficient documentation

## 2015-10-07 DIAGNOSIS — Z800 Family history of malignant neoplasm of digestive organs: Secondary | ICD-10-CM | POA: Insufficient documentation

## 2015-10-07 DIAGNOSIS — E039 Hypothyroidism, unspecified: Secondary | ICD-10-CM | POA: Diagnosis not present

## 2015-10-07 DIAGNOSIS — Z79899 Other long term (current) drug therapy: Secondary | ICD-10-CM | POA: Diagnosis not present

## 2015-10-07 DIAGNOSIS — Z1211 Encounter for screening for malignant neoplasm of colon: Secondary | ICD-10-CM | POA: Diagnosis present

## 2015-10-07 DIAGNOSIS — Z860100 Personal history of colon polyps, unspecified: Secondary | ICD-10-CM

## 2015-10-07 DIAGNOSIS — Z833 Family history of diabetes mellitus: Secondary | ICD-10-CM | POA: Insufficient documentation

## 2015-10-07 DIAGNOSIS — K644 Residual hemorrhoidal skin tags: Secondary | ICD-10-CM

## 2015-10-07 DIAGNOSIS — Z1231 Encounter for screening mammogram for malignant neoplasm of breast: Secondary | ICD-10-CM

## 2015-10-07 HISTORY — PX: COLONOSCOPY: SHX5424

## 2015-10-07 SURGERY — COLONOSCOPY
Anesthesia: Moderate Sedation

## 2015-10-07 MED ORDER — MIDAZOLAM HCL 5 MG/5ML IJ SOLN
INTRAMUSCULAR | Status: AC
  Filled 2015-10-07: qty 10

## 2015-10-07 MED ORDER — MIDAZOLAM HCL 5 MG/5ML IJ SOLN
INTRAMUSCULAR | Status: DC | PRN
  Administered 2015-10-07 (×2): 2 mg via INTRAVENOUS
  Administered 2015-10-07: 1 mg via INTRAVENOUS
  Administered 2015-10-07: 2 mg via INTRAVENOUS
  Administered 2015-10-07: 1 mg via INTRAVENOUS

## 2015-10-07 MED ORDER — MEPERIDINE HCL 50 MG/ML IJ SOLN
INTRAMUSCULAR | Status: AC
  Filled 2015-10-07: qty 1

## 2015-10-07 MED ORDER — MEPERIDINE HCL 50 MG/ML IJ SOLN
INTRAMUSCULAR | Status: DC | PRN
  Administered 2015-10-07: 50 mg via INTRAVENOUS
  Administered 2015-10-07: 25 mg via INTRAVENOUS

## 2015-10-07 MED ORDER — STERILE WATER FOR IRRIGATION IR SOLN
Status: DC | PRN
  Administered 2015-10-07: 14:00:00

## 2015-10-07 MED ORDER — SODIUM CHLORIDE 0.9 % IV SOLN
INTRAVENOUS | Status: DC
  Administered 2015-10-07: 12:00:00 via INTRAVENOUS

## 2015-10-07 NOTE — Op Note (Signed)
[facility] Patient Name: Lisa Potter Procedure Date: 10/07/2015 1:03 PM MRN: [id number] Date of Birth: [birth year] Attending MD: Hildred Laser , MD CSN: [id number] Age: 50 Admit Type: Outpatient Procedure:                Colonoscopy Indications:              High risk colon cancer surveillance: Personal                            history of colonic polyps, Family history of colon                            cancer in a first-degree relative Providers:                Hildred Laser, MD, Otis Peak B. Sharon Seller, RN, Randa Spike, Technician Referring MD:             Grace Bushy. Wolfgang Phoenix, MD Medicines:                Meperidine 50 mg IV, Midazolam 8 mg IV Complications:            No immediate complications. Estimated Blood Loss:     Estimated blood loss: none. Procedure:                Pre-Anesthesia Assessment:                           - Prior to the procedure, a History and Physical                            was performed, and patient medications and                            allergies were reviewed. The patient's tolerance of                            previous anesthesia was also reviewed. The risks                            and benefits of the procedure and the sedation                            options and risks were discussed with the patient.                            All questions were answered, and informed consent                            was obtained. Prior Anticoagulants: The patient has                            taken no previous anticoagulant or antiplatelet  agents. ASA Grade Assessment: II - A patient with                            mild systemic disease. After reviewing the risks                            and benefits, the patient was deemed in                            satisfactory condition to undergo the procedure.                           After obtaining informed consent, the colonoscope                             was passed under direct vision. Throughout the                            procedure, the patient's blood pressure, pulse, and                            oxygen saturations were monitored continuously. The                            EC-[id number]) scope was introduced through                            the anus and advanced to the the cecum, identified                            by appendiceal orifice and ileocecal valve. The                            colonoscopy was somewhat difficult due to                            significant looping. Successful completion of the                            procedure was aided by changing the patient to a                            supine position, using manual pressure and                            withdrawing and reinserting the scope. The patient                            tolerated the procedure well. The quality of the                            bowel preparation was good. The ileocecal valve,  appendiceal orifice, and rectum were photographed. Scope In: 1:46:10 PM Scope Out: 2:13:48 PM Total Procedure Duration: 0 hours 27 minutes 38 seconds  Findings:      A single small-mouthed diverticulum was found in the hepatic flexure.      A single medium-mouthed diverticulum was found in the splenic flexure.      The exam was otherwise without abnormality.      External hemorrhoids were found during retroflexion. The hemorrhoids       were small.      Anal papilla(e) were hypertrophied. Impression:               - Diverticulosis at the hepatic flexure.                           - Diverticulosis at the splenic flexure.                           - The examination was otherwise normal.                           - External hemorrhoids.                           - Anal papilla(e) were hypertrophied.                           - No specimens collected. Moderate Sedation:      Moderate (conscious) sedation was  administered by the endoscopy nurse       and supervised by the endoscopist. The following parameters were       monitored: oxygen saturation, heart rate, blood pressure, CO2       capnography and response to care. Total physician intraservice time was       32 minutes. Recommendation:           - Patient has a contact number available for                            emergencies. The signs and symptoms of potential                            delayed complications were discussed with the                            patient. Return to normal activities tomorrow.                            Written discharge instructions were provided to the                            patient.                           - Resume previous diet today.                           - Continue present medications.                           -  Repeat colonoscopy in 5 years for surveillance. Procedure Code(s):        --- Professional ---                           [phone], Colonoscopy, flexible; diagnostic, including                            collection of specimen(s) by brushing or washing,                            when performed (separate procedure)                           99152, Moderate sedation services provided by the                            same physician or other qualified health care                            professional performing the diagnostic or                            therapeutic service that the sedation supports,                            requiring the presence of an independent trained                            observer to assist in the monitoring of the                            patient's level of consciousness and physiological                            status; initial 15 minutes of intraservice time,                            patient age 56 years or older                           [phone], Moderate sedation services; each additional                            15 minutes intraservice  time Diagnosis Code(s):        --- Professional ---                           Z86.010, Personal history of colonic polyps                           K64.4, Residual hemorrhoidal skin tags                           K62.89, Other specified diseases of anus and rectum  Z80.0, Family history of malignant neoplasm of                            digestive organs                           K57.30, Diverticulosis of large intestine without                            perforation or abscess without bleeding CPT copyright 2016 American Medical Association. All rights reserved. The codes documented in this report are preliminary and upon coder review may  be revised to meet current compliance requirements. Hildred Laser, MD Hildred Laser, MD 10/07/2015 2:23:11 PM This report has been signed electronically. Number of Addenda: 0

## 2015-10-07 NOTE — Discharge Instructions (Signed)
Resume usual medications and high fiber diet. No driving for 24 hours. Next colonoscopy in 5 years.   Colonoscopy, Care After These instructions give you information on caring for yourself after your procedure. Your doctor may also give you more specific instructions. Call your doctor if you have any problems or questions after your procedure. HOME CARE  Do not drive for 24 hours.  Do not sign important papers or use machinery for 24 hours.  You may shower.  You may go back to your usual activities, but go slower for the first 24 hours.  Take rest breaks often during the first 24 hours.  Walk around or use warm packs on your belly (abdomen) if you have belly cramping or gas.  Drink enough fluids to keep your pee (urine) clear or pale yellow.  Resume your normal diet. Avoid heavy or fried foods.  Avoid drinking alcohol for 24 hours or as told by your doctor.  Only take medicines as told by your doctor. If a tissue sample (biopsy) was taken during the procedure:   Do not take aspirin or blood thinners for 7 days, or as told by your doctor.  Do not drink alcohol for 7 days, or as told by your doctor.  Eat soft foods for the first 24 hours. GET HELP IF: You still have a small amount of blood in your poop (stool) 2-3 days after the procedure. GET HELP RIGHT AWAY IF:  You have more than a small amount of blood in your poop.  You see clumps of tissue (blood clots) in your poop.  Your belly is puffy (swollen).  You feel sick to your stomach (nauseous) or throw up (vomit).  You have a fever.  You have belly pain that gets worse and medicine does not help. MAKE SURE YOU:  Understand these instructions.  Will watch your condition.  Will get help right away if you are not doing well or get worse.   This information is not intended to replace advice given to you by your health care provider. Make sure you discuss any questions you have with your health care provider.     Document Released: 01/22/2010 Document Revised: 12/25/2012 Document Reviewed: 08/27/2012 Elsevier Interactive Patient Education 2016 [employer].  Hemorrhoids Hemorrhoids are swollen veins around the rectum or anus. There are two types of hemorrhoids:   Internal hemorrhoids. These occur in the veins just inside the rectum. They may poke through to the outside and become irritated and painful.  External hemorrhoids. These occur in the veins outside the anus and can be felt as a painful swelling or hard lump near the anus. CAUSES  Pregnancy.   Obesity.   Constipation or diarrhea.   Straining to have a bowel movement.   Sitting for long periods on the toilet.  Heavy lifting or other activity that caused you to strain.  Anal intercourse. SYMPTOMS   Pain.   Anal itching or irritation.   Rectal bleeding.   Fecal leakage.   Anal swelling.   One or more lumps around the anus.  DIAGNOSIS  Your caregiver may be able to diagnose hemorrhoids by visual examination. Other examinations or tests that may be performed include:   Examination of the rectal area with a gloved hand (digital rectal exam).   Examination of anal canal using a small tube (scope).   A blood test if you have lost a significant amount of blood.  A test to look inside the colon (sigmoidoscopy or colonoscopy). TREATMENT Most  hemorrhoids can be treated at home. However, if symptoms do not seem to be getting better or if you have a lot of rectal bleeding, your caregiver may perform a procedure to help make the hemorrhoids get smaller or remove them completely. Possible treatments include:   Placing a rubber band at the base of the hemorrhoid to cut off the circulation (rubber band ligation).   Injecting a chemical to shrink the hemorrhoid (sclerotherapy).   Using a tool to burn the hemorrhoid (infrared light therapy).   Surgically removing the hemorrhoid (hemorrhoidectomy).   Stapling  the hemorrhoid to block blood flow to the tissue (hemorrhoid stapling).  HOME CARE INSTRUCTIONS   Eat foods with fiber, such as whole grains, beans, nuts, fruits, and vegetables. Ask your doctor about taking products with added fiber in them (fibersupplements).  Increase fluid intake. Drink enough water and fluids to keep your urine clear or pale yellow.   Exercise regularly.   Go to the bathroom when you have the urge to have a bowel movement. Do not wait.   Avoid straining to have bowel movements.   Keep the anal area dry and clean. Use wet toilet paper or moist towelettes after a bowel movement.   Medicated creams and suppositories may be used or applied as directed.   Only take over-the-counter or prescription medicines as directed by your caregiver.   Take warm sitz baths for 15-20 minutes, 3-4 times a day to ease pain and discomfort.   Place ice packs on the hemorrhoids if they are tender and swollen. Using ice packs between sitz baths may be helpful.   Put ice in a plastic bag.   Place a towel between your skin and the bag.   Leave the ice on for 15-20 minutes, 3-4 times a day.   Do not use a donut-shaped pillow or sit on the toilet for long periods. This increases blood pooling and pain.  SEEK MEDICAL CARE IF:  You have increasing pain and swelling that is not controlled by treatment or medicine.  You have uncontrolled bleeding.  You have difficulty or you are unable to have a bowel movement.  You have pain or inflammation outside the area of the hemorrhoids. MAKE SURE YOU:  Understand these instructions.  Will watch your condition.  Will get help right away if you are not doing well or get worse.   This information is not intended to replace advice given to you by your health care provider. Make sure you discuss any questions you have with your health care provider.   Document Released: 12/18/1999 Document Revised: 12/07/2011 Document Reviewed:  10/25/2011 Elsevier Interactive Patient Education 2016 [employer].    Diverticulosis Diverticulosis is the condition that develops when small pouches (diverticula) form in the wall of your colon. Your colon, or large intestine, is where water is absorbed and stool is formed. The pouches form when the inside layer of your colon pushes through weak spots in the outer layers of your colon. CAUSES  No one knows exactly what causes diverticulosis. RISK FACTORS  Being older than 44. Your risk for this condition increases with age. Diverticulosis is rare in people younger than 40 years. By age 30, almost everyone has it.  Eating a low-fiber diet.  Being frequently constipated.  Being overweight.  Not getting enough exercise.  Smoking.  Taking over-the-counter pain medicines, like aspirin and ibuprofen. SYMPTOMS  Most people with diverticulosis do not have symptoms. DIAGNOSIS  Because diverticulosis often has no symptoms, health care  providers often discover the condition during an exam for other colon problems. In many cases, a health care provider will diagnose diverticulosis while using a flexible scope to examine the colon (colonoscopy). TREATMENT  If you have never developed an infection related to diverticulosis, you may not need treatment. If you have had an infection before, treatment may include:  Eating more fruits, vegetables, and grains.  Taking a fiber supplement.  Taking a live bacteria supplement (probiotic).  Taking medicine to relax your colon. HOME CARE INSTRUCTIONS   Drink at least 6-8 glasses of water each day to prevent constipation.  Try not to strain when you have a bowel movement.  Keep all follow-up appointments. If you have had an infection before:  Increase the fiber in your diet as directed by your health care provider or dietitian.  Take a dietary fiber supplement if your health care provider approves.  Only take medicines as directed by  your health care provider. SEEK MEDICAL CARE IF:   You have abdominal pain.  You have bloating.  You have cramps.  You have not gone to the bathroom in 3 days. SEEK IMMEDIATE MEDICAL CARE IF:   Your pain gets worse.  Yourbloating becomes very bad.  You have a fever or chills, and your symptoms suddenly get worse.  You begin vomiting.  You have bowel movements that are bloody or black. MAKE SURE YOU:  Understand these instructions.  Will watch your condition.  Will get help right away if you are not doing well or get worse.   This information is not intended to replace advice given to you by your health care provider. Make sure you discuss any questions you have with your health care provider.   Document Released: 09/17/2003 Document Revised: 12/25/2012 Document Reviewed: 11/14/2012 Elsevier Interactive Patient Education [employer].

## 2015-10-07 NOTE — H&P (Signed)
Lisa Potter is an 50 y.o. female.   Chief Complaint: Patient is here for colonoscopy. HPI: Patient is 50 year old female who is history of colonic polyp and is here for surveillance colonoscopy. She denies abdominal pain change in bowel habits or rectal bleeding. Last colonoscopy was 5 years ago with removal of small polyp and was sessile serrated polyp. 2 small polyps coagulator. Family history significant for CRC in her twin sister at age 15. She died of metastatic disease at age 41.   Past Medical History:  Diagnosis Date  . Headache(784.0)   . Hypothyroidism   . Vitamin D deficiency   . Vitiligo     Past Surgical History:  Procedure Laterality Date  . COLONOSCOPY  10/06/2010   Procedure: COLONOSCOPY;  Surgeon: Rogene Houston, MD;  Location: [facility];  Service: Endoscopy;  Laterality: N/A;  . DILATION AND CURETTAGE OF UTERUS      Family History  Problem Relation Age of Onset  . Colon cancer Sister   . Hypothyroidism Sister   . Hypertension Mother   . Diabetes Mother    Social History:  reports that she has never smoked. She has never used smokeless tobacco. She reports that she does not drink alcohol or use drugs.  Allergies: No Known Allergies  Medications Prior to Admission  Medication Sig Dispense Refill  . betamethasone dipropionate (DIPROLENE) 0.05 % cream Apply topically 2 (two) times daily. Prn up to 2 weeks at a time 45 g 0  . BLACK COHOSH EXTRACT PO Take by mouth.    . calcipotriene (DOVONOX) 0.005 % cream Apply topically 2 (two) times daily. Prn psoriasis; do not apply to face 60 g 0  . Calcium Carbonate (CALCI-CHEW PO) Take by mouth.    . clobetasol cream (TEMOVATE) [id number] % Apply 1 application topically 2 (two) times daily.    Marland Kitchen levothyroxine (SYNTHROID, LEVOTHROID) 50 MCG tablet TAKE 1 TABLET(50 MCG) BY MOUTH DAILY 30 tablet 5  . OVER THE COUNTER MEDICATION Vit d 2,000 iu    . levothyroxine (SYNTHROID, LEVOTHROID) 50 MCG tablet TAKE 1 TABLET BY MOUTH EVERY  DAY 30 tablet 0  . valACYclovir (VALTREX) 1000 MG tablet Take 1 tablet (1,000 mg total) by mouth 2 (two) times daily. (Patient not taking: Reported on 10/07/2015) 20 tablet 0    No results found for this or any previous visit (from the past 48 hour(s)). No results found.  ROS  Blood pressure (!) 141/72, pulse 67, temperature 98.3 F (36.8 C), temperature source Oral, resp. rate 10, height 5\' 3"  (1.6 m), weight 160 lb (72.6 kg), last menstrual period 09/05/2013, SpO2 100 %. Physical Exam  Constitutional: She appears well-developed and well-nourished.  HENT:  Mouth/Throat: Oropharynx is clear and moist.  Eyes: Conjunctivae are normal. No scleral icterus.  Neck: No thyromegaly present.  Cardiovascular: Normal rate, regular rhythm and normal heart sounds.   No murmur heard. Respiratory: Effort normal and breath sounds normal.  GI: Soft. She exhibits no distension and no mass. There is no tenderness.  Musculoskeletal: She exhibits no edema.  Lymphadenopathy:    She has no cervical adenopathy.  Neurological: She is alert.  Skin: Skin is warm and dry.     Assessment/Plan History of sessile serrated polyp. Family history of CRC in a sister at age 16. Surveillance colonoscopy.  Hildred Laser, MD 10/07/2015, 1:35 PM

## 2015-10-13 ENCOUNTER — Encounter (HOSPITAL_COMMUNITY): Payer: Self-pay | Admitting: Internal Medicine

## 2015-10-14 ENCOUNTER — Encounter (INDEPENDENT_AMBULATORY_CARE_PROVIDER_SITE_OTHER): Payer: Self-pay | Admitting: *Deleted

## 2015-10-19 ENCOUNTER — Ambulatory Visit (HOSPITAL_COMMUNITY)
Admission: RE | Admit: 2015-10-19 | Discharge: 2015-10-19 | Disposition: A | Discharge status: Home / Self Care | Payer: BLUE CROSS/BLUE SHIELD | Source: Ambulatory Visit | Attending: Nurse Practitioner | Admitting: Nurse Practitioner

## 2015-10-19 DIAGNOSIS — Z1231 Encounter for screening mammogram for malignant neoplasm of breast: Secondary | ICD-10-CM | POA: Diagnosis not present

## 2016-02-24 ENCOUNTER — Other Ambulatory Visit: Payer: Self-pay | Admitting: Family Medicine

## 2016-02-24 DIAGNOSIS — E039 Hypothyroidism, unspecified: Secondary | ICD-10-CM

## 2016-02-24 NOTE — Telephone Encounter (Signed)
One mo worth, call pt nd have her do tsh

## 2016-02-25 NOTE — Telephone Encounter (Signed)
Left message return call 02/25/2016

## 2016-03-18 LAB — TSH: TSH: 7.74 u[IU]/mL — ABNORMAL HIGH (ref 0.450–4.500)

## 2016-03-25 ENCOUNTER — Other Ambulatory Visit: Payer: Self-pay | Admitting: Family Medicine

## 2016-03-28 MED ORDER — LEVOTHYROXINE SODIUM 75 MCG PO TABS: ORAL_TABLET | ORAL | 3 refills | Status: DC

## 2016-06-12 LAB — TSH: TSH: 0.159 u[IU]/mL — ABNORMAL LOW (ref 0.450–4.500)

## 2016-09-21 ENCOUNTER — Other Ambulatory Visit: Payer: Self-pay | Admitting: Nurse Practitioner

## 2016-09-21 DIAGNOSIS — Z1231 Encounter for screening mammogram for malignant neoplasm of breast: Secondary | ICD-10-CM

## 2016-10-12 ENCOUNTER — Encounter: Payer: Self-pay | Admitting: Nurse Practitioner

## 2016-10-12 ENCOUNTER — Ambulatory Visit (INDEPENDENT_AMBULATORY_CARE_PROVIDER_SITE_OTHER): Payer: BLUE CROSS/BLUE SHIELD | Admitting: Nurse Practitioner

## 2016-10-12 VITALS — BP 122/84 | Ht 63.0 in | Wt 172.6 lb

## 2016-10-12 DIAGNOSIS — Z780 Asymptomatic menopausal state: Secondary | ICD-10-CM

## 2016-10-12 DIAGNOSIS — Z0000 Encounter for general adult medical examination without abnormal findings: Secondary | ICD-10-CM

## 2016-10-12 DIAGNOSIS — E039 Hypothyroidism, unspecified: Secondary | ICD-10-CM | POA: Diagnosis not present

## 2016-10-12 MED ORDER — GABAPENTIN 100 MG PO CAPS: 100.0000 mg | ORAL_CAPSULE | Freq: Every day | ORAL | 2 refills | Status: DC

## 2016-10-12 NOTE — Patient Instructions (Addendum)
Levothyroxine Dose- Take a whole pill Sunday, Monday, Tuesday, Thursday, Friday and take 1/2 pill every Wednesday and Saturday. We will recheck your thyroid levels in January.  Gabapentin 100mg  take this medication at bedtime for about 2-3 weeks and see if it helps with your menopause symptoms. If your symptoms are not improved, let us know and we can discuss other options.

## 2016-10-12 NOTE — Progress Notes (Signed)
Subjective:    Patient ID: Lisa Potter, female    DOB: 10/14/1965, 51 y.o.   MRN: [id number]  HPI: 51 y/o female presents today for annual wellness exam. Pt reports overall good health. Denies tobacco or alcohol use. Pt reports good dietary habits and regular physical activity- walking 2 miles 3-5x/wk. Pt denies any depressed thoughts or suicidal ideation. Reports last colonoscopy was one year ago- results normal. Pt states her twin sister died of colon cancer. Pt reports annual eye exams and regular dental visits. Reports annual mammograms next one is scheduled this month- denies any concerns. Pt states her flu vaccine and routine lab work are provided through work- copies of lab work requested for our records. Pt reports her psoriasis is stable. Pt reports weight gain in recent months with no change in diet or activity. Pt stated she changed the dosage of levothyroxine 93mcg from 1 tab a day to 1/2 tab every three days in April due to a misunderstanding about her dosage. She denies changes to her mood, appetite, skin, hair, or energy levels. Pt reports she has been experiencing hot flashes 10-12 times per day for appx the last two years. She reports the symptoms waking her from her sleep and affecting her while she is at work. She has tried over the counter estrovan, black cohosh, and soy without any changes to symptoms. She requests a prescription medication to help with these symptoms.    Review of Systems  Constitutional: Negative for activity change, appetite change and fatigue.  HENT: Negative for dental problem, ear pain, sinus pressure and sore throat.   Respiratory: Negative for cough, chest tightness, shortness of breath and wheezing.   Cardiovascular: Negative for chest pain.  Gastrointestinal: Negative for abdominal distention, abdominal pain, constipation, diarrhea, nausea and vomiting.  Genitourinary: Negative for difficulty urinating, dysuria, enuresis, frequency, genital sores,  pelvic pain, urgency, vaginal bleeding and vaginal discharge.  Psychiatric/Behavioral: Positive for sleep disturbance.   Depression screen PHQ 2/9 10/12/2016  Decreased Interest 0  Down, Depressed, Hopeless 0  PHQ - 2 Score 0      Objective:   Physical Exam  Constitutional: She is oriented to person, place, and time. She appears well-developed. No distress.  HENT:  Right Ear: External ear normal.  Left Ear: External ear normal.  Mouth/Throat: Oropharynx is clear and moist.  Eyes: Pupils are equal, round, and reactive to light.  Neck: Normal range of motion. Neck supple. No tracheal deviation present. No thyromegaly present.  Cardiovascular: Normal rate, regular rhythm and normal heart sounds.  Exam reveals no gallop.   No murmur heard. Pulmonary/Chest: Effort normal and breath sounds normal.  Breasts: breasts appear normal, no suspicious masses, no skin or nipple changes or axillary nodes.  Abdominal: Soft. She exhibits no distension. There is no tenderness.  Genitourinary: Vagina normal and uterus normal.  Genitourinary Comments: External genitalia intact with no lesions, discharge, erythema, or tenderness. Vagina no discharge. Cervix normal limit in appearance. No CMT. Bimanual exam no tenderness or obvious masses.   Musculoskeletal: Normal range of motion. She exhibits no edema.  Lymphadenopathy:    She has no cervical adenopathy.  Neurological: She is alert and oriented to person, place, and time.  Skin: Skin is warm and dry. No rash noted.  Psychiatric: She has a normal mood and affect. Her behavior is normal.  Vitals reviewed.  Vitals:   10/12/16 0816  BP: 122/84  Weight: 172 lb 9.6 oz (78.3 kg)  Height: 5\' 3"  (1.6 m)  Assessment & Plan:  1. Routine general medical examination at a health care facility  2. Hypothyroidism, unspecified type - Instructed to take levothyroxine 53mcg 1 tab every Sunday, Monday, Tuesday, Thursday, and Friday and 1/2 tab every  Wednesday and Saturday - TSH recheck in January, 2019  3. Menopause - Gabapentin 100mg  1 tab qHS for menopause symptoms.   . Meds ordered this encounter  Medications  . gabapentin (NEURONTIN) 100 MG capsule    Sig: Take 1 capsule (100 mg total) by mouth at bedtime.    Dispense:  30 capsule    Refill:  2    Order Specific Question:   Supervising Provider    Answer:   Mikey Kirschner [[id number]]  call back in 4-6 weeks if no improvement in menopausal symptoms, sooner if any problems with new medication.  Return in about 1 year (around 10/12/2017).

## 2016-10-14 ENCOUNTER — Telehealth: Payer: Self-pay | Admitting: Nurse Practitioner

## 2016-10-14 ENCOUNTER — Encounter: Payer: Self-pay | Admitting: Nurse Practitioner

## 2016-10-14 MED ORDER — CLOBETASOL PROPIONATE 0.05 % EX CREA: 1.0000 "application " | TOPICAL_CREAM | Freq: Two times a day (BID) | CUTANEOUS | 5 refills | Status: DC

## 2016-10-14 NOTE — Telephone Encounter (Signed)
Resubmitted to the pt pharmacy.

## 2016-10-14 NOTE — Telephone Encounter (Signed)
We refilled the clobetasol cream for her psoriasis that day as she requested. Did she want something else?

## 2016-10-14 NOTE — Telephone Encounter (Signed)
Pt says we didn't order her a Rx for her psoriasis  Please advise  [facility]/[place]

## 2016-10-20 ENCOUNTER — Ambulatory Visit (HOSPITAL_COMMUNITY)
Admission: RE | Admit: 2016-10-20 | Discharge: 2016-10-20 | Disposition: A | Discharge status: Home / Self Care | Payer: BLUE CROSS/BLUE SHIELD | Source: Ambulatory Visit | Attending: Nurse Practitioner | Admitting: Nurse Practitioner

## 2016-10-20 DIAGNOSIS — Z1231 Encounter for screening mammogram for malignant neoplasm of breast: Secondary | ICD-10-CM

## 2016-10-31 ENCOUNTER — Other Ambulatory Visit: Payer: Self-pay | Admitting: Family Medicine

## 2017-01-14 DIAGNOSIS — E039 Hypothyroidism, unspecified: Secondary | ICD-10-CM | POA: Diagnosis not present

## 2017-01-15 LAB — TSH: TSH: 1.64 u[IU]/mL (ref 0.450–4.500)

## 2017-01-17 ENCOUNTER — Other Ambulatory Visit: Payer: Self-pay | Admitting: Nurse Practitioner

## 2017-05-19 ENCOUNTER — Other Ambulatory Visit: Payer: Self-pay | Admitting: Family Medicine

## 2017-06-02 DIAGNOSIS — M1000 Idiopathic gout, unspecified site: Secondary | ICD-10-CM | POA: Diagnosis not present

## 2017-06-05 ENCOUNTER — Encounter: Payer: Self-pay | Admitting: Nurse Practitioner

## 2017-06-05 ENCOUNTER — Ambulatory Visit: Payer: BLUE CROSS/BLUE SHIELD | Admitting: Nurse Practitioner

## 2017-06-05 ENCOUNTER — Ambulatory Visit (HOSPITAL_COMMUNITY)
Admission: RE | Admit: 2017-06-05 | Discharge: 2017-06-05 | Disposition: A | Discharge status: Home / Self Care | Payer: BLUE CROSS/BLUE SHIELD | Source: Ambulatory Visit | Attending: Nurse Practitioner | Admitting: Nurse Practitioner

## 2017-06-05 VITALS — BP 154/88 | Temp 98.2°F | Ht 63.0 in | Wt 177.0 lb

## 2017-06-05 DIAGNOSIS — M25572 Pain in left ankle and joints of left foot: Secondary | ICD-10-CM | POA: Diagnosis not present

## 2017-06-05 DIAGNOSIS — M7989 Other specified soft tissue disorders: Secondary | ICD-10-CM | POA: Insufficient documentation

## 2017-06-05 MED ORDER — NAPROXEN 500 MG PO TABS: 500.0000 mg | ORAL_TABLET | Freq: Two times a day (BID) | ORAL | 0 refills | Status: DC

## 2017-06-06 ENCOUNTER — Encounter: Payer: Self-pay | Admitting: Nurse Practitioner

## 2017-06-06 LAB — URIC ACID: Uric Acid: 4.6 mg/dL (ref 2.5–7.1)

## 2017-06-06 LAB — SEDIMENTATION RATE: Sed Rate: 96 mm/hr — ABNORMAL HIGH (ref 0–40)

## 2017-06-06 NOTE — Progress Notes (Signed)
Subjective: Presents for complaints of swelling and pain in the left ankle area that began on 5/25.  Was extremely swollen at one point.  Worse with prolonged walking.  No history of injury.  Is wondering whether it could be an insect bite but has no history of any stings or bites.  No fever.  Went to urgent care on 531, no labs or x-rays were done at that time.  She states she was told they can treat her for gout with medication but patient was reluctant to do this due to potential side effects and no verification of the diagnosis.  Has no previous history of gout.  Does have psoriasis, has had a flareup on her left hand recently.  No previous joint pain.  Objective:   BP (!) 154/88   Temp 98.2 F (36.8 C) (Oral)   Ht 5\' 3"  (1.6 m)   Wt 177 lb (80.3 kg)   LMP 09/05/2013   BMI 31.35 kg/m  NAD.  Alert, oriented.  Lungs clear.  Heart regular rate rhythm.  The lateral right ankle at the malleolus is faintly pink in color mild edema with localized significant tenderness around the malleolus.  Normal ROM of the left ankle with no tenderness noted.  No joint laxity.  Assessment:  Left lateral ankle pain - Plan: DG Ankle Complete Left, Uric acid, Sedimentation rate    Plan:   Meds ordered this encounter  Medications  . naproxen (NAPROSYN) 500 MG tablet    Sig: Take 1 tablet (500 mg total) by mouth 2 (two) times daily with a meal. Prn pain    Dispense:  30 tablet    Refill:  0    Order Specific Question:   Supervising Provider    Answer:   Maggie Font   X-ray and lab work pending.  Since swelling and pain is significantly improved, start NSAID as directed.  If no improvement by the end of the week plan prednisone taper.  Explained to patient that this could come from several different sources including psoriatic arthritis or gout.  Call back if symptoms worsen or persist.

## 2017-06-15 ENCOUNTER — Other Ambulatory Visit: Payer: Self-pay | Admitting: *Deleted

## 2017-06-15 ENCOUNTER — Telehealth: Payer: Self-pay | Admitting: Nurse Practitioner

## 2017-06-15 MED ORDER — DICLOFENAC SODIUM 75 MG PO TBEC: 75.0000 mg | DELAYED_RELEASE_TABLET | Freq: Two times a day (BID) | ORAL | 0 refills | Status: DC

## 2017-06-15 NOTE — Telephone Encounter (Signed)
Stop naprosyn start voltaren 75 28 one p o bid with foo prn pain

## 2017-06-15 NOTE — Telephone Encounter (Signed)
Patient seen Lisa Potter on 06/05/17 for her ankle swelling and painful.  The naproxen is not helping.  She wants to know if something else can be called in?  [facility] [address]

## 2017-06-15 NOTE — Telephone Encounter (Signed)
Pt.notified

## 2017-06-15 NOTE — Telephone Encounter (Signed)
Med sent to pharm. Left message for pt to return call to discuss

## 2017-06-21 ENCOUNTER — Other Ambulatory Visit: Payer: Self-pay | Admitting: Family Medicine

## 2017-06-23 ENCOUNTER — Telehealth: Payer: Self-pay | Admitting: Family Medicine

## 2017-06-23 MED ORDER — PREDNISONE 20 MG PO TABS: ORAL_TABLET | ORAL | 0 refills | Status: DC

## 2017-06-23 NOTE — Telephone Encounter (Signed)
Medication was sent in and I called the pt to r/c.

## 2017-06-23 NOTE — Telephone Encounter (Signed)
Patient is aware 

## 2017-06-23 NOTE — Telephone Encounter (Signed)
Seen by [name] for this on 06/08/2017,was given naproxen.

## 2017-06-23 NOTE — Telephone Encounter (Signed)
Pt seen recently for swelling in foot & ankle Was given meds & meds are not helping  Pt wonders if there's anything else to try  Pt would like a response today [name] is not here today & pt would like doctor to look into this)  Please advise & call pt   [facility]

## 2017-06-23 NOTE — Telephone Encounter (Signed)
Adult pred taper f u in office next wk if no improvement

## 2017-06-23 NOTE — Telephone Encounter (Signed)
Please see below.

## 2017-07-19 ENCOUNTER — Other Ambulatory Visit: Payer: Self-pay | Admitting: Nurse Practitioner

## 2017-09-01 ENCOUNTER — Other Ambulatory Visit: Payer: Self-pay | Admitting: Family Medicine

## 2017-10-05 ENCOUNTER — Other Ambulatory Visit (HOSPITAL_COMMUNITY): Payer: Self-pay | Admitting: Nurse Practitioner

## 2017-10-05 DIAGNOSIS — Z1231 Encounter for screening mammogram for malignant neoplasm of breast: Secondary | ICD-10-CM

## 2017-10-09 ENCOUNTER — Other Ambulatory Visit: Payer: Self-pay | Admitting: Family Medicine

## 2017-10-16 ENCOUNTER — Encounter: Payer: Self-pay | Admitting: Family Medicine

## 2017-10-16 ENCOUNTER — Ambulatory Visit (INDEPENDENT_AMBULATORY_CARE_PROVIDER_SITE_OTHER): Payer: BLUE CROSS/BLUE SHIELD | Admitting: Family Medicine

## 2017-10-16 VITALS — BP 128/72 | Ht 62.75 in | Wt 180.0 lb

## 2017-10-16 DIAGNOSIS — Z780 Asymptomatic menopausal state: Secondary | ICD-10-CM

## 2017-10-16 DIAGNOSIS — R194 Change in bowel habit: Secondary | ICD-10-CM | POA: Diagnosis not present

## 2017-10-16 DIAGNOSIS — E039 Hypothyroidism, unspecified: Secondary | ICD-10-CM | POA: Diagnosis not present

## 2017-10-16 DIAGNOSIS — L409 Psoriasis, unspecified: Secondary | ICD-10-CM

## 2017-10-16 DIAGNOSIS — Z0000 Encounter for general adult medical examination without abnormal findings: Secondary | ICD-10-CM | POA: Diagnosis not present

## 2017-10-16 MED ORDER — GABAPENTIN 100 MG PO CAPS: 200.0000 mg | ORAL_CAPSULE | Freq: Every day | ORAL | 5 refills | Status: DC

## 2017-10-16 MED ORDER — CLOBETASOL PROPIONATE 0.05 % EX CREA: 1.0000 "application " | TOPICAL_CREAM | Freq: Two times a day (BID) | CUTANEOUS | 5 refills | Status: DC

## 2017-10-16 MED ORDER — LEVOTHYROXINE SODIUM 75 MCG PO TABS: 75.0000 ug | ORAL_TABLET | Freq: Every day | ORAL | 3 refills | Status: DC

## 2017-10-16 NOTE — Progress Notes (Signed)
Subjective:    Patient ID: Lisa Potter, female    DOB: 03/21/1965, 52 y.o.   MRN: [id number]  HPI The patient comes in today for a wellness visit.   A review of their health history was completed.  A review of medications was also completed.  Any needed refills; levothyroxine  Eating habits: health conscious  Falls/  MVA accidents in past few months: none  Regular exercise: walking about 2 miles daily.   Specialist pt sees on regular basis: sees Dr. Nevada Potter, dermatologist, for her psoriasis  Preventative health issues were discussed. Regular eye and dental exams.  Additional concerns: Reports bowel movements have changed over the last 6 months. They are now hard and round balls. Has not changed eating habits or medications. Denies blood in stool or abdominal pain.  States concerned because her sister died from colon cancer.   Patient had blood work done at work on 10/11/17, that included a TSH, she is still awaiting results and will make sure we get a copy.   Hypothyroidism: Doing well on current medication regimen, denies adverse effects, reports compliance  Psoriasis is stable. Requesting refill on clobetasol cream.   Menopause: States Lisa Potter started her on gabapentin at last visit to help with her sleep disruptions d/t hot flashes, reports has helped some, but still waking up about 3-4 times per night with hot flashes, despite gabapentin use nightly.  Review of Systems  Constitutional: Negative for chills, fatigue, fever and unexpected weight change.  HENT: Negative for congestion, ear pain, sinus pressure, sinus pain and sore throat.   Eyes: Negative for discharge and visual disturbance.  Respiratory: Negative for cough, shortness of breath and wheezing.   Cardiovascular: Negative for chest pain and leg swelling.  Gastrointestinal: Positive for constipation. Negative for abdominal pain, blood in stool, diarrhea, nausea and vomiting.  Genitourinary: Negative for difficulty  urinating and hematuria.  Skin: Negative for color change.  Neurological: Negative for dizziness, weakness, light-headedness and headaches.  Hematological: Negative for adenopathy.  Psychiatric/Behavioral: Negative for suicidal ideas.  All other systems reviewed and are negative.      Objective:   Physical Exam  Constitutional: She is oriented to person, place, and time. She appears well-developed and well-nourished. No distress.  HENT:  Head: Normocephalic and atraumatic.  Right Ear: Tympanic membrane normal.  Left Ear: Tympanic membrane normal.  Nose: Nose normal.  Mouth/Throat: Oropharynx is clear and moist.  Eyes: Pupils are equal, round, and reactive to light. Conjunctivae and EOM are normal. Right eye exhibits no discharge. Left eye exhibits no discharge.  Neck: Neck supple. No thyromegaly present.  Cardiovascular: Normal rate, regular rhythm and normal heart sounds.  No murmur heard. Pulmonary/Chest: Effort normal and breath sounds normal. No respiratory distress. She has no wheezes. Right breast exhibits no inverted nipple, no mass, no nipple discharge, no skin change and no tenderness. Left breast exhibits no inverted nipple, no mass, no nipple discharge, no skin change and no tenderness.  Abdominal: Soft. Bowel sounds are normal. She exhibits no distension and no mass. There is no tenderness.  Genitourinary: Vagina normal and uterus normal. There is no rash, tenderness or lesion on the right labia. There is no rash, tenderness or lesion on the left labia. Cervix exhibits no motion tenderness and no discharge. Right adnexum displays no mass and no tenderness. Left adnexum displays no mass and no tenderness. No vaginal discharge found.  Musculoskeletal: She exhibits no edema or deformity.  Lymphadenopathy:    She has no  cervical adenopathy.  Neurological: She is alert and oriented to person, place, and time. Coordination normal.  Skin: Skin is warm and dry.  Psychiatric: She has  a normal mood and affect.  Nursing note and vitals reviewed.     Assessment & Plan:  1. Routine general medical examination at a health care facility  Adult wellness-complete.wellness physical was conducted today. Importance of diet and exercise were discussed in detail.  In addition to this a discussion regarding safety was also covered. We also reviewed over immunizations and gave recommendations regarding current immunization needed for age.  In addition to this additional areas were also touched on including: Preventative health exams needed:  Colonoscopy due in 2022. Mammogram scheduled for next week. Pap Smear due in 2021, d/t negative pap and negative HPV co-testing  Patient was advised yearly wellness exam  2. Hypothyroidism, unspecified type  Awaiting results from lab work pt had drawn at her workplace, she will ensure we get a copy of results. TSH from January 2019 stable, will continue current dose of levothyroxine for now, refills sent in. May need to make adjustment based on lab results.   3. Change in bowel habit  Given patient's family history of colon cancer in a 1st degree relative, will go ahead and refer her back to her GI specialist for further evaluation of her change in bowel habits.   4. Psoriasis  This is currently well-controlled on current regimen. Will refill clobetasol cream.  5. Menopause  Pt reports some benefit from gabapentin use, discussed with Dr. Richardson Potter, okay to increase to 200 mg at bedtime and see if the increased dose further improves her sleep disruption from hot flashes. Pt will let us know and f/u if her symptoms worsen or fail to improve.   Dr. Richardson Potter was consulted on this visit and is in agreement with the above plan.

## 2017-10-18 ENCOUNTER — Ambulatory Visit (INDEPENDENT_AMBULATORY_CARE_PROVIDER_SITE_OTHER): Payer: BLUE CROSS/BLUE SHIELD | Admitting: Internal Medicine

## 2017-10-18 ENCOUNTER — Encounter (INDEPENDENT_AMBULATORY_CARE_PROVIDER_SITE_OTHER): Payer: Self-pay | Admitting: Internal Medicine

## 2017-10-18 VITALS — BP 150/82 | HR 68 | Temp 98.0°F | Ht 63.0 in | Wt 179.7 lb

## 2017-10-18 DIAGNOSIS — R195 Other fecal abnormalities: Secondary | ICD-10-CM | POA: Diagnosis not present

## 2017-10-18 DIAGNOSIS — Z800 Family history of malignant neoplasm of digestive organs: Secondary | ICD-10-CM | POA: Diagnosis not present

## 2017-10-18 NOTE — Patient Instructions (Addendum)
3 stool cards home with patient.  Stool softener.

## 2017-10-18 NOTE — Progress Notes (Signed)
Subjective:    Patient ID: Lisa Potter, female    DOB: 1965-11-18, 52 y.o.   MRN: [id number]  HPI Here today with c/o change in her bowel. Family hx of colon cancer in a sister.  Her last colonoscopy was in October of 2017 (person hx of colon polyps, family hx of colon cancer in a first degree relative).  Impression:               - Diverticulosis at the hepatic flexure.                           - Diverticulosis at the splenic flexure.                           - The examination was otherwise normal.                           - External hemorrhoids.                           - Anal papilla(e) were hypertrophied. She tells me her stool has changed. She says her stools are hard and are like little and sometimes like big ball. Her stools use to be soft and in one piece. She has had this constipation for about 6 months. Has been on Gabapentin for about a year. She has not had any weight loss. She actually has had weight gain. Her appetite is good. No melena or BRRB. She is not on a stool softener.  Review of Systems Past Medical History:  Diagnosis Date  . Headache(784.0)   . Hypothyroidism   . Vitamin D deficiency   . Vitiligo     Past Surgical History:  Procedure Laterality Date  . COLONOSCOPY  10/06/2010   Procedure: COLONOSCOPY;  Surgeon: Rogene Houston, MD;  Location: [facility];  Service: Endoscopy;  Laterality: N/A;  . COLONOSCOPY N/A 10/07/2015   Procedure: COLONOSCOPY;  Surgeon: Rogene Houston, MD;  Location: [facility];  Service: Endoscopy;  Laterality: N/A;  100  . DILATION AND CURETTAGE OF UTERUS      No Known Allergies  Current Outpatient Medications on File Prior to Visit  Medication Sig Dispense Refill  . BLACK COHOSH EXTRACT PO Take by mouth.    . Calcium Carbonate (CALCI-CHEW PO) Take by mouth.    . clobetasol cream (TEMOVATE) 7.12 % Apply 1 application topically 2 (two) times daily. 30 g 5  . gabapentin (NEURONTIN) 100 MG capsule Take 2 capsules (200 mg  total) by mouth at bedtime. (Patient taking differently: Take 100 mg by mouth at bedtime. ) 30 capsule 5  . levothyroxine (SYNTHROID, LEVOTHROID) 75 MCG tablet Take 1 tablet (75 mcg total) by mouth daily. Except for Wednesday and Saturday, take 1/2 tablet. 30 tablet 3  . OVER THE COUNTER MEDICATION Vit d 2,000 iu     No current facility-administered medications on file prior to visit.         Objective:   Physical Exam Blood pressure (!) 150/82, pulse 68, temperature 98 F (36.7 C), height 5\' 3"  (1.6 m), weight 179 lb 11.2 oz (81.5 kg), last menstrual period 09/05/2013. Alert and oriented. Skin warm and dry. Oral mucosa is moist.   . Sclera anicteric, conjunctivae is pink. Thyroid not enlarged. No cervical lymphadenopathy. Lungs clear. Heart regular  rate and rhythm.  Abdomen is soft. Bowel sounds are positive. No hepatomegaly. No abdominal masses felt. No tenderness.  No edema to lower extremities.           Assessment & Plan:  Change in stool. Am going to send 3 stools cards home with patient. She is to use a stool softener.

## 2017-10-23 ENCOUNTER — Ambulatory Visit (HOSPITAL_COMMUNITY)
Admission: RE | Admit: 2017-10-23 | Discharge: 2017-10-23 | Disposition: A | Discharge status: Home / Self Care | Payer: BLUE CROSS/BLUE SHIELD | Source: Ambulatory Visit | Attending: Family Medicine | Admitting: Family Medicine

## 2017-10-23 DIAGNOSIS — Z1231 Encounter for screening mammogram for malignant neoplasm of breast: Secondary | ICD-10-CM | POA: Insufficient documentation

## 2017-10-24 ENCOUNTER — Other Ambulatory Visit: Payer: Self-pay | Admitting: Family Medicine

## 2017-10-24 DIAGNOSIS — R928 Other abnormal and inconclusive findings on diagnostic imaging of breast: Secondary | ICD-10-CM

## 2017-10-26 ENCOUNTER — Telehealth: Payer: Self-pay | Admitting: Family Medicine

## 2017-10-26 NOTE — Telephone Encounter (Signed)
Patient advised per Dr Richardson Landry- the [facility] showed a possible "asymmetry" in the right breast, likely will be ok but should do to be on safe side. Patient verbalized understanding and stated she will call back and schedule additional views as recommended.

## 2017-10-26 NOTE — Telephone Encounter (Signed)
Pt called in stating [facility] radiology told her she needs to schedule another mammogram. Pt is unsure why another one is needing to be scheduled when she had one on Monday. She has not gotten the results from [facility] mammogram. Please make sure pt is aware to call and schedule the next mammo once results are given. Thank you!

## 2017-10-26 NOTE — Telephone Encounter (Signed)
Usually the staff at radiology will let folks know generally why, the mammo showed a possible "asymmetry" in the right breast, likely will be ok but should do to be on safe side

## 2017-10-26 NOTE — Telephone Encounter (Signed)
Left message to return call 

## 2017-10-27 ENCOUNTER — Telehealth (INDEPENDENT_AMBULATORY_CARE_PROVIDER_SITE_OTHER): Payer: Self-pay | Admitting: *Deleted

## 2017-10-27 NOTE — Telephone Encounter (Signed)
   Diagnosis:    Result(s)   Card 1: Negative: 10/21/2017                                                                                  Stool was brown in color.  Card 2: Negative: 10/23/2017   Card 3:Negative: 10/24/2017    Completed by: Thomas Hoff , LPN   HEMOCCULT SENSA DEVELOPER: LOT#:  [id number] EXPIRATION DATE: 2021-11   HEMOCCULT SENSA CARD:  LOT#:  [id number] EXPIRATION DATE: 05/21   CARD CONTROL RESULTS:  POSITIVE: Positive NEGATIVE: Negative    ADDITIONAL COMMENTS: Patient was called and given results. Forwarded to ordering provider for review.

## 2017-11-07 NOTE — Telephone Encounter (Signed)
OV in 5 months.

## 2017-11-14 ENCOUNTER — Ambulatory Visit (HOSPITAL_COMMUNITY)
Admission: RE | Admit: 2017-11-14 | Discharge: 2017-11-14 | Disposition: A | Discharge status: Home / Self Care | Payer: BLUE CROSS/BLUE SHIELD | Source: Ambulatory Visit | Attending: Family Medicine | Admitting: Family Medicine

## 2017-11-14 DIAGNOSIS — R928 Other abnormal and inconclusive findings on diagnostic imaging of breast: Secondary | ICD-10-CM | POA: Insufficient documentation

## 2017-11-14 DIAGNOSIS — N6489 Other specified disorders of breast: Secondary | ICD-10-CM | POA: Diagnosis not present

## 2018-04-09 ENCOUNTER — Other Ambulatory Visit: Payer: Self-pay

## 2018-04-09 ENCOUNTER — Ambulatory Visit (INDEPENDENT_AMBULATORY_CARE_PROVIDER_SITE_OTHER): Payer: BLUE CROSS/BLUE SHIELD | Admitting: Internal Medicine

## 2018-04-09 ENCOUNTER — Encounter (INDEPENDENT_AMBULATORY_CARE_PROVIDER_SITE_OTHER): Payer: Self-pay | Admitting: Internal Medicine

## 2018-04-09 DIAGNOSIS — R195 Other fecal abnormalities: Secondary | ICD-10-CM

## 2018-04-09 NOTE — Progress Notes (Addendum)
   Subjective:  3 40pm-355pm. Total time 15 minutes.   Patient ID: Lisa Potter, female    DOB: 10/06/1965, 53 y.o.   MRN: [id number]  HPI This is a telephone office visit. Patient is agreeable to talk with me at [place]. Unable to due video OV. Telephone visit due to COVID-19.  I identified patient by 2 means (name and birthday). Her PCP is Dr. Baltazar Apo. Last saw in October of 2019 for change in bowels. There is a family hx of colon cancer in a sister ( age 13).Marland Kitchen Her last colonoscopy was in 2017 (personal hx of colon polyps, family hx of colon cancer in a 1st degree relative.  Impression: - Diverticulosis at the hepatic flexure. - Diverticulosis at the splenic flexure. - The examination was otherwise normal. - External hemorrhoids. - Anal papilla(e) were hypertrophied. Had been having some constipation for 6 months. All the stool cards sent home with patient with negative for blood.  She tells me she is doing good. 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 She is having a BM every other day and sometimes daily. No melena or BRRB. She says her stools are better and she is not constipated. She is drinking more water. Her appetite is good. No weight loss.  She is married.  She is exercising by walking outside.  Her son is 76 and is at home. He is working at [employer]. He has not been sick. Her 2 brother are doing okay. She does not know if they have had a colonoscopy.   Review of Systems Past Medical History:  Diagnosis Date  . Headache(784.0)   . Hypothyroidism   . Vitamin D deficiency   . Vitiligo     Past Surgical History:  Procedure Laterality Date  . COLONOSCOPY  10/06/2010   Procedure: COLONOSCOPY;  Surgeon: Rogene Houston, MD;  Location: [facility];  Service: Endoscopy;  Laterality: N/A;  . COLONOSCOPY N/A 10/07/2015   Procedure: COLONOSCOPY;  Surgeon: Rogene Houston, MD;  Location: [facility];  Service: Endoscopy;  Laterality: N/A;  100  . DILATION AND CURETTAGE OF UTERUS      No Known Allergies  Current Outpatient Medications on File Prior to Visit  Medication Sig Dispense Refill  . BLACK COHOSH EXTRACT PO Take by mouth.    . Calcium Carbonate (CALCI-CHEW PO) Take by mouth.    . clobetasol cream (TEMOVATE) 1.59 % Apply 1 application topically 2 (two) times daily. 30 g 5  . levothyroxine (SYNTHROID, LEVOTHROID) 75 MCG tablet Take 1 tablet (75 mcg total) by mouth daily. Except for Wednesday and Saturday, take 1/2 tablet. 30 tablet 3  . OVER THE COUNTER MEDICATION Vit d 2,000 iu     No current facility-administered medications on file prior to visit.         Objective:   Physical Exam  deferred      Assessment & Plan:  Change in stool. Family hx of colon cancer in a sister. OV in 1 year. Advised to drink plenty of water and lots of fiber. OV in 1 year.

## 2018-04-09 NOTE — Patient Instructions (Signed)
Advised to drink plenty of fluids. Increase fiber in diet. OV in 1 year

## 2018-04-11 ENCOUNTER — Other Ambulatory Visit: Payer: Self-pay | Admitting: Family Medicine

## 2018-04-12 NOTE — Telephone Encounter (Signed)
Six mo worth 

## 2018-09-19 ENCOUNTER — Telehealth: Payer: Self-pay | Admitting: Family Medicine

## 2018-09-19 DIAGNOSIS — Z131 Encounter for screening for diabetes mellitus: Secondary | ICD-10-CM

## 2018-09-19 DIAGNOSIS — E039 Hypothyroidism, unspecified: Secondary | ICD-10-CM

## 2018-09-19 DIAGNOSIS — Z13220 Encounter for screening for lipoid disorders: Secondary | ICD-10-CM

## 2018-09-19 DIAGNOSIS — R5383 Other fatigue: Secondary | ICD-10-CM

## 2018-09-19 NOTE — Telephone Encounter (Signed)
Lip liv m7 cbc tsh

## 2018-09-19 NOTE — Telephone Encounter (Signed)
Last labs 06/2017: TSH, Uric acid and Sed rate

## 2018-09-19 NOTE — Telephone Encounter (Signed)
Patient has physical on 10/16 and needing labs

## 2018-09-20 NOTE — Telephone Encounter (Signed)
Blood work ordered in Epic. Left message to return call to notify patient. 

## 2018-09-25 NOTE — Telephone Encounter (Signed)
Pt returned call and verbalized understanding  

## 2018-09-25 NOTE — Telephone Encounter (Signed)
Left message to return call 

## 2018-10-08 DIAGNOSIS — Z131 Encounter for screening for diabetes mellitus: Secondary | ICD-10-CM | POA: Diagnosis not present

## 2018-10-08 DIAGNOSIS — E039 Hypothyroidism, unspecified: Secondary | ICD-10-CM | POA: Diagnosis not present

## 2018-10-08 DIAGNOSIS — Z13220 Encounter for screening for lipoid disorders: Secondary | ICD-10-CM | POA: Diagnosis not present

## 2018-10-08 DIAGNOSIS — R5383 Other fatigue: Secondary | ICD-10-CM | POA: Diagnosis not present

## 2018-10-09 LAB — BASIC METABOLIC PANEL
BUN/Creatinine Ratio: 10 (ref 9–23)
BUN: 9 mg/dL (ref 6–24)
CO2: 24 mmol/L (ref 20–29)
Calcium: 9.8 mg/dL (ref 8.7–10.2)
Chloride: 103 mmol/L (ref 96–106)
Creatinine, Ser: 0.87 mg/dL (ref 0.57–1.00)
GFR calc Af Amer: 89 mL/min/{1.73_m2} (ref >59)
GFR calc non Af Amer: 77 mL/min/{1.73_m2} (ref >59)
Glucose: 81 mg/dL (ref 65–99)
Potassium: 4.2 mmol/L (ref 3.5–5.2)
Sodium: 141 mmol/L (ref 134–144)

## 2018-10-09 LAB — CBC WITH DIFFERENTIAL/PLATELET
Basophils Absolute: 0 10*3/uL (ref 0.0–0.2)
Basos: 0 %
EOS (ABSOLUTE): 0.1 10*3/uL (ref 0.0–0.4)
Eos: 1 %
Hematocrit: 40.5 % (ref 34.0–46.6)
Hemoglobin: 14.1 g/dL (ref 11.1–15.9)
Immature Grans (Abs): 0 10*3/uL (ref 0.0–0.1)
Immature Granulocytes: 0 %
Lymphocytes Absolute: 2.5 10*3/uL (ref 0.7–3.1)
Lymphs: 28 %
MCH: 30.8 pg (ref 26.6–33.0)
MCHC: 34.8 g/dL (ref 31.5–35.7)
MCV: 88 fL (ref 79–97)
Monocytes Absolute: 0.6 10*3/uL (ref 0.1–0.9)
Monocytes: 7 %
Neutrophils Absolute: 5.6 10*3/uL (ref 1.4–7.0)
Neutrophils: 64 %
Platelets: 345 10*3/uL (ref 150–450)
RBC: 4.58 x10E6/uL (ref 3.77–5.28)
RDW: 12.9 % (ref 11.7–15.4)
WBC: 9 10*3/uL (ref 3.4–10.8)

## 2018-10-09 LAB — LIPID PANEL
Chol/HDL Ratio: 4.6 ratio — ABNORMAL HIGH (ref 0.0–4.4)
Cholesterol, Total: 208 mg/dL — ABNORMAL HIGH (ref 100–199)
HDL: 45 mg/dL (ref >39)
LDL Chol Calc (NIH): 133 mg/dL — ABNORMAL HIGH (ref 0–99)
Triglycerides: 166 mg/dL — ABNORMAL HIGH (ref 0–149)
VLDL Cholesterol Cal: 30 mg/dL (ref 5–40)

## 2018-10-09 LAB — TSH: TSH: 2.01 u[IU]/mL (ref 0.450–4.500)

## 2018-10-09 LAB — HEPATIC FUNCTION PANEL
ALT: 16 IU/L (ref 0–32)
AST: 25 IU/L (ref 0–40)
Albumin: 4.8 g/dL (ref 3.8–4.9)
Alkaline Phosphatase: 116 IU/L (ref 39–117)
Bilirubin Total: 0.4 mg/dL (ref 0.0–1.2)
Bilirubin, Direct: 0.08 mg/dL (ref 0.00–0.40)
Total Protein: 7.8 g/dL (ref 6.0–8.5)

## 2018-10-19 ENCOUNTER — Ambulatory Visit (INDEPENDENT_AMBULATORY_CARE_PROVIDER_SITE_OTHER): Payer: BC Managed Care – PPO | Admitting: Family Medicine

## 2018-10-19 ENCOUNTER — Encounter: Payer: Self-pay | Admitting: Family Medicine

## 2018-10-19 ENCOUNTER — Other Ambulatory Visit: Payer: Self-pay

## 2018-10-19 VITALS — BP 112/76 | Temp 97.9°F | Ht 63.5 in | Wt 180.0 lb

## 2018-10-19 DIAGNOSIS — Z23 Encounter for immunization: Secondary | ICD-10-CM

## 2018-10-19 DIAGNOSIS — Z0000 Encounter for general adult medical examination without abnormal findings: Secondary | ICD-10-CM | POA: Diagnosis not present

## 2018-10-19 DIAGNOSIS — E039 Hypothyroidism, unspecified: Secondary | ICD-10-CM

## 2018-10-19 DIAGNOSIS — L409 Psoriasis, unspecified: Secondary | ICD-10-CM

## 2018-10-19 MED ORDER — LEVOTHYROXINE SODIUM 75 MCG PO TABS: ORAL_TABLET | ORAL | 3 refills | Status: DC

## 2018-10-19 MED ORDER — CLOBETASOL PROPIONATE 0.05 % EX CREA: 1.0000 "application " | TOPICAL_CREAM | Freq: Two times a day (BID) | CUTANEOUS | 5 refills | Status: DC

## 2018-10-19 NOTE — Progress Notes (Signed)
Subjective:    Patient ID: Lisa Potter, female    DOB: 1965-03-08, 53 y.o.   MRN: [id number]  HPI The patient comes in today for a wellness visit.    Results for orders placed or performed in visit on 09/19/18  Lipid panel  Result Value Ref Range   Cholesterol, Total 208 (H) 100 - 199 mg/dL   Triglycerides 166 (H) 0 - 149 mg/dL   HDL 45 >39 mg/dL   VLDL Cholesterol Cal 30 5 - 40 mg/dL   LDL Chol Calc (NIH) 133 (H) 0 - 99 mg/dL   Chol/HDL Ratio 4.6 (H) 0.0 - 4.4 ratio  Hepatic function panel  Result Value Ref Range   Total Protein 7.8 6.0 - 8.5 g/dL   Albumin 4.8 3.8 - 4.9 g/dL   Bilirubin Total 0.4 0.0 - 1.2 mg/dL   Bilirubin, Direct 0.08 0.00 - 0.40 mg/dL   Alkaline Phosphatase 116 39 - 117 IU/L   AST 25 0 - 40 IU/L   ALT 16 0 - 32 IU/L  Basic metabolic panel  Result Value Ref Range   Glucose 81 65 - 99 mg/dL   BUN 9 6 - 24 mg/dL   Creatinine, Ser 0.87 0.57 - 1.00 mg/dL   GFR calc non Af Amer 77 >59 mL/min/1.73   GFR calc Af Amer 89 >59 mL/min/1.73   BUN/Creatinine Ratio 10 9 - 23   Sodium 141 134 - 144 mmol/L   Potassium 4.2 3.5 - 5.2 mmol/L   Chloride 103 96 - 106 mmol/L   CO2 24 20 - 29 mmol/L   Calcium 9.8 8.7 - 10.2 mg/dL  CBC with Differential/Platelet  Result Value Ref Range   WBC 9.0 3.4 - 10.8 x10E3/uL   RBC 4.58 3.77 - 5.28 x10E6/uL   Hemoglobin 14.1 11.1 - 15.9 g/dL   Hematocrit 40.5 34.0 - 46.6 %   MCV 88 79 - 97 fL   MCH 30.8 26.6 - 33.0 pg   MCHC 34.8 31.5 - 35.7 g/dL   RDW 12.9 11.7 - 15.4 %   Platelets 345 150 - 450 x10E3/uL   Neutrophils 64 Not Estab. %   Lymphs 28 Not Estab. %   Monocytes 7 Not Estab. %   Eos 1 Not Estab. %   Basos 0 Not Estab. %   Neutrophils Absolute 5.6 1.4 - 7.0 x10E3/uL   Lymphocytes Absolute 2.5 0.7 - 3.1 x10E3/uL   Monocytes Absolute 0.6 0.1 - 0.9 x10E3/uL   EOS (ABSOLUTE) 0.1 0.0 - 0.4 x10E3/uL   Basophils Absolute 0.0 0.0 - 0.2 x10E3/uL   Immature Granulocytes 0 Not Estab. %   Immature Grans (Abs) 0.0 0.0 -  0.1 x10E3/uL  TSH  Result Value Ref Range   TSH 2.010 0.450 - 4.500 uIU/mL     A review of their health history was completed.  A review of medications was also completed.  Any needed refills; levothyroxine and clobetasol cream  Eating habits: health conscious  Falls/  MVA accidents in past few months: none  Regular exercise: walk 2 -4 miles 2 -3 times a week.   Specialist pt sees on regular basis: none  Preventative health issues were discussed.   Additional concerns: would like flu vaccine today.    Patient compliant with thyroid medication.  Has not missed a dose.  No symptoms of high or low thyroid.  Takes her medication faithfully  Has chronic hand dermatitis.  Clobetasol definitely helps.  Uses sparingly no obvious side effects Review of  Systems  Constitutional: Negative for activity change, appetite change and fatigue.  HENT: Negative for congestion and rhinorrhea.   Eyes: Negative for discharge.  Respiratory: Negative for cough, chest tightness and wheezing.   Cardiovascular: Negative for chest pain.  Gastrointestinal: Negative for abdominal pain, blood in stool and vomiting.  Endocrine: Negative for polyphagia.  Genitourinary: Negative for difficulty urinating and frequency.  Musculoskeletal: Negative for neck pain.  Skin: Negative for color change.  Allergic/Immunologic: Negative for environmental allergies and food allergies.  Neurological: Negative for weakness and headaches.  Psychiatric/Behavioral: Negative for agitation and behavioral problems.  All other systems reviewed and are negative.      Objective:   Physical Exam Vitals signs reviewed.  Constitutional:      Appearance: She is well-developed.  HENT:     Head: Normocephalic.     Right Ear: External ear normal.     Left Ear: External ear normal.  Eyes:     Pupils: Pupils are equal, round, and reactive to light.  Neck:     Musculoskeletal: Normal range of motion.     Thyroid: No  thyromegaly.  Cardiovascular:     Rate and Rhythm: Normal rate and regular rhythm.     Heart sounds: Normal heart sounds. No murmur.  Pulmonary:     Effort: Pulmonary effort is normal. No respiratory distress.     Breath sounds: Normal breath sounds. No wheezing.  Abdominal:     General: Bowel sounds are normal. There is no distension.     Palpations: Abdomen is soft. There is no mass.     Tenderness: There is no abdominal tenderness.  Musculoskeletal: Normal range of motion.        General: No tenderness.  Lymphadenopathy:     Cervical: No cervical adenopathy.  Skin:    General: Skin is warm and dry.  Neurological:     Mental Status: She is alert and oriented to person, place, and time.     Motor: No abnormal muscle tone.  Psychiatric:        Behavior: Behavior normal.   Pelvic exam Pap smear not obtained.  Cervical exam normal no adnexal masses noted rectal exam within normal limits  Breast without masses        Assessment & Plan:  Impression 1 wellness exam.  Due mammogram later this year.  Flu shot today.  Diet discussed.  Exercise discussed.  Pap smear 4 years ago within normal limits.  Colonoscopy 3 years ago  2.  Hypothyroidism.  TSH excellent compliance discussed medication refill  3.  Chronic hand dermatitis clobetasol refilled  Flu shot exercise diet discussed blood work discussed

## 2018-10-19 NOTE — Patient Instructions (Signed)
Results for orders placed or performed in visit on 09/19/18  Lipid panel  Result Value Ref Range   Cholesterol, Total 208 (H) 100 - 199 mg/dL   Triglycerides 166 (H) 0 - 149 mg/dL   HDL 45 >39 mg/dL   VLDL Cholesterol Cal 30 5 - 40 mg/dL   LDL Chol Calc (NIH) 133 (H) 0 - 99 mg/dL   Chol/HDL Ratio 4.6 (H) 0.0 - 4.4 ratio  Hepatic function panel  Result Value Ref Range   Total Protein 7.8 6.0 - 8.5 g/dL   Albumin 4.8 3.8 - 4.9 g/dL   Bilirubin Total 0.4 0.0 - 1.2 mg/dL   Bilirubin, Direct 0.08 0.00 - 0.40 mg/dL   Alkaline Phosphatase 116 39 - 117 IU/L   AST 25 0 - 40 IU/L   ALT 16 0 - 32 IU/L  Basic metabolic panel  Result Value Ref Range   Glucose 81 65 - 99 mg/dL   BUN 9 6 - 24 mg/dL   Creatinine, Ser 0.87 0.57 - 1.00 mg/dL   GFR calc non Af Amer 77 >59 mL/min/1.73   GFR calc Af Amer 89 >59 mL/min/1.73   BUN/Creatinine Ratio 10 9 - 23   Sodium 141 134 - 144 mmol/L   Potassium 4.2 3.5 - 5.2 mmol/L   Chloride 103 96 - 106 mmol/L   CO2 24 20 - 29 mmol/L   Calcium 9.8 8.7 - 10.2 mg/dL  CBC with Differential/Platelet  Result Value Ref Range   WBC 9.0 3.4 - 10.8 x10E3/uL   RBC 4.58 3.77 - 5.28 x10E6/uL   Hemoglobin 14.1 11.1 - 15.9 g/dL   Hematocrit 40.5 34.0 - 46.6 %   MCV 88 79 - 97 fL   MCH 30.8 26.6 - 33.0 pg   MCHC 34.8 31.5 - 35.7 g/dL   RDW 12.9 11.7 - 15.4 %   Platelets 345 150 - 450 x10E3/uL   Neutrophils 64 Not Estab. %   Lymphs 28 Not Estab. %   Monocytes 7 Not Estab. %   Eos 1 Not Estab. %   Basos 0 Not Estab. %   Neutrophils Absolute 5.6 1.4 - 7.0 x10E3/uL   Lymphocytes Absolute 2.5 0.7 - 3.1 x10E3/uL   Monocytes Absolute 0.6 0.1 - 0.9 x10E3/uL   EOS (ABSOLUTE) 0.1 0.0 - 0.4 x10E3/uL   Basophils Absolute 0.0 0.0 - 0.2 x10E3/uL   Immature Granulocytes 0 Not Estab. %   Immature Grans (Abs) 0.0 0.0 - 0.1 x10E3/uL  TSH  Result Value Ref Range   TSH 2.010 0.450 - 4.500 uIU/mL    Preventing High Cholesterol Cholesterol is a white, waxy substance similar to  fat that the human body needs to help build cells. The liver makes all the cholesterol that a person's body needs. Having high cholesterol (hypercholesterolemia) increases a person's risk for heart disease and stroke. Extra (excess) cholesterol comes from the food the person eats. High cholesterol can often be prevented with diet and lifestyle changes. If you already have high cholesterol, you can control it with diet and lifestyle changes and with medicine. How can high cholesterol affect me? If you have high cholesterol, deposits (plaques) may build up on the walls of your arteries. The arteries are the blood vessels that carry blood away from your heart. Plaques make the arteries narrower and stiffer. This can limit or block blood flow and cause blood clots to form. Blood clots:  Are tiny balls of cells that form in your blood.  Can move to the  heart or brain, causing a heart attack or stroke. Plaques in arteries greatly increase your risk for heart attack and stroke.Making diet and lifestyle changes can reduce your risk for these conditions that may threaten your life. What can increase my risk? This condition is more likely to develop in people who:  Eat foods that are high in saturated fat or cholesterol. Saturated fat is mostly found in: ? Foods that contain animal fat, such as red meat and some dairy products. ? Certain fatty foods made from plants, such as tropical oils.  Are overweight.  Are not getting enough exercise.  Have a family history of high cholesterol. What actions can I take to prevent this? Nutrition   Eat less saturated fat.  Avoid trans fats (partially hydrogenated oils). These are often found in margarine and in some baked goods, fried foods, and snacks bought in packages.  Avoid precooked or cured meat, such as sausages or meat loaves.  Avoid foods and drinks that have added sugars.  Eat more fruits, vegetables, and whole grains.  Choose healthy sources  of protein, such as fish, poultry, lean cuts of red meat, beans, peas, lentils, and nuts.  Choose healthy sources of fat, such as: ? Nuts. ? Vegetable oils, especially olive oil. ? Fish that have healthy fats (omega-3 fatty acids), such as mackerel or salmon. The items listed above may not be a complete list of recommended foods and beverages. Contact a dietitian for more information. Lifestyle  Lose weight if you are overweight. Losing 5-10 lb (2.3-4.5 kg) can help prevent or control high cholesterol. It can also lower your risk for diabetes and high blood pressure. Ask your health care provider to help you with a diet and exercise plan to lose weight safely.  Do not use any products that contain nicotine or tobacco, such as cigarettes, e-cigarettes, and chewing tobacco. If you need help quitting, ask your health care provider.  Limit your alcohol intake. ? Do not drink alcohol if:  Your health care provider tells you not to drink.  You are pregnant, may be pregnant, or are planning to become pregnant. ? If you drink alcohol:  Limit how much you use to:  0-1 drink a day for women.  0-2 drinks a day for men.  Be aware of how much alcohol is in your drink. In the [place] one drink equals one 12 oz bottle of beer (355 mL), one 5 oz glass of wine (148 mL), or one 1 oz glass of hard liquor (44 mL). Activity   Get enough exercise. Each week, do at least 150 minutes of exercise that takes a medium level of effort (moderate-intensity exercise). ? This is exercise that:  Makes your heart beat faster and makes you breathe harder than usual.  Allows you to still be able to talk. ? You could exercise in short sessions several times a day or longer sessions a few times a week. For example, on 5 days each week, you could walk fast or ride your bike 3 times a day for 10 minutes each time.  Do exercises as told by your health care provider. Medicines  In addition to diet and lifestyle  changes, your health care provider may recommend medicines to help lower cholesterol. This may be a medicine to lower the amount of cholesterol your liver makes. You may need medicine if: ? Diet and lifestyle changes do not lower your cholesterol enough. ? You have high cholesterol and other risk factors for heart disease  or stroke.  Take over-the-counter and prescription medicines only as told by your health care provider. General information  Manage your risk factors for high cholesterol. Talk with your health care provider about all your risk factors and how to lower your risk.  Manage other conditions that you have, such as diabetes or high blood pressure (hypertension).  Have blood tests to check your cholesterol levels at regular points in time as told by your health care provider.  Keep all follow-up visits as told by your health care provider. This is important. Where to find more information  American Heart Association: www.heart.org  National Heart, Lung, and Blood Institute: https://wilson-eaton.com/ Summary  High cholesterol increases your risk for heart disease and stroke. By keeping your cholesterol level low, you can reduce your risk for these conditions.  High cholesterol can often be prevented with diet and lifestyle changes.  Work with your health care provider to manage your risk factors, and have your blood tested regularly. This information is not intended to replace advice given to you by your health care provider. Make sure you discuss any questions you have with your health care provider. Document Released: 01/04/2015 Document Revised: 04/13/2018 Document Reviewed: 08/29/2015 Elsevier Patient Education  2020 [employer].

## 2018-10-27 ENCOUNTER — Other Ambulatory Visit: Payer: Self-pay | Admitting: Family Medicine

## 2018-11-06 ENCOUNTER — Telehealth: Payer: Self-pay | Admitting: Family Medicine

## 2018-11-06 DIAGNOSIS — R928 Other abnormal and inconclusive findings on diagnostic imaging of breast: Secondary | ICD-10-CM

## 2018-11-06 NOTE — Telephone Encounter (Signed)
Pt needs refill on her levothyroxine (SYNTHROID) 75 MCG tablet   Pt had her physical here 10/19/2018  Also pt states that [facility] radiology needs an order for her mammogram due to she needs a diagnostic mammo  Please advise & call pt    [facility]/[place]

## 2018-11-06 NOTE — Telephone Encounter (Signed)
Levothyroxine was sent in on 10/19/2018. Please advise regarding mammogram. Thank you

## 2018-11-07 NOTE — Telephone Encounter (Signed)
Diagnostic mammogram scheduled for Tuesday November 17 at 2:20 pm; arrive at 2 pm to register. Left message to return call

## 2018-11-07 NOTE — Telephone Encounter (Signed)
diag mammo of right to f u from last nov, also time for screening mammo of left

## 2018-11-07 NOTE — Telephone Encounter (Signed)
Pt returned call and verbalized understanding  

## 2018-11-07 NOTE — Telephone Encounter (Signed)
Diagnostic mammogram for left and ultrasound also scheduled. Per Radiology, a diagnostic and ultrasound of both breast have to be ordered. Left message to return call

## 2018-11-20 ENCOUNTER — Ambulatory Visit (HOSPITAL_COMMUNITY): Payer: BC Managed Care – PPO

## 2018-11-20 ENCOUNTER — Ambulatory Visit (HOSPITAL_COMMUNITY)
Admission: RE | Admit: 2018-11-20 | Discharge: 2018-11-20 | Disposition: A | Discharge status: Home / Self Care | Payer: BC Managed Care – PPO | Source: Ambulatory Visit | Attending: Family Medicine | Admitting: Family Medicine

## 2018-11-20 ENCOUNTER — Other Ambulatory Visit: Payer: Self-pay

## 2018-11-20 ENCOUNTER — Ambulatory Visit (HOSPITAL_COMMUNITY): Admission: RE | Admit: 2018-11-20 | Payer: BC Managed Care – PPO | Source: Ambulatory Visit

## 2018-11-20 DIAGNOSIS — R928 Other abnormal and inconclusive findings on diagnostic imaging of breast: Secondary | ICD-10-CM | POA: Insufficient documentation

## 2019-02-06 ENCOUNTER — Encounter: Payer: Self-pay | Admitting: Family Medicine

## 2019-04-09 ENCOUNTER — Ambulatory Visit (INDEPENDENT_AMBULATORY_CARE_PROVIDER_SITE_OTHER): Payer: BLUE CROSS/BLUE SHIELD | Admitting: Nurse Practitioner

## 2019-05-20 ENCOUNTER — Ambulatory Visit (INDEPENDENT_AMBULATORY_CARE_PROVIDER_SITE_OTHER): Payer: BC Managed Care – PPO | Admitting: Internal Medicine

## 2019-05-29 IMAGING — DX DG ANKLE COMPLETE 3+V*L*
3 series · 3 of 3 positions shown · non-contrast
Comparison: None.

CLINICAL DATA: Left ankle pain and swelling, no known injury

EXAM:
LEFT ANKLE COMPLETE - 3+ VIEW

[ankle ap]
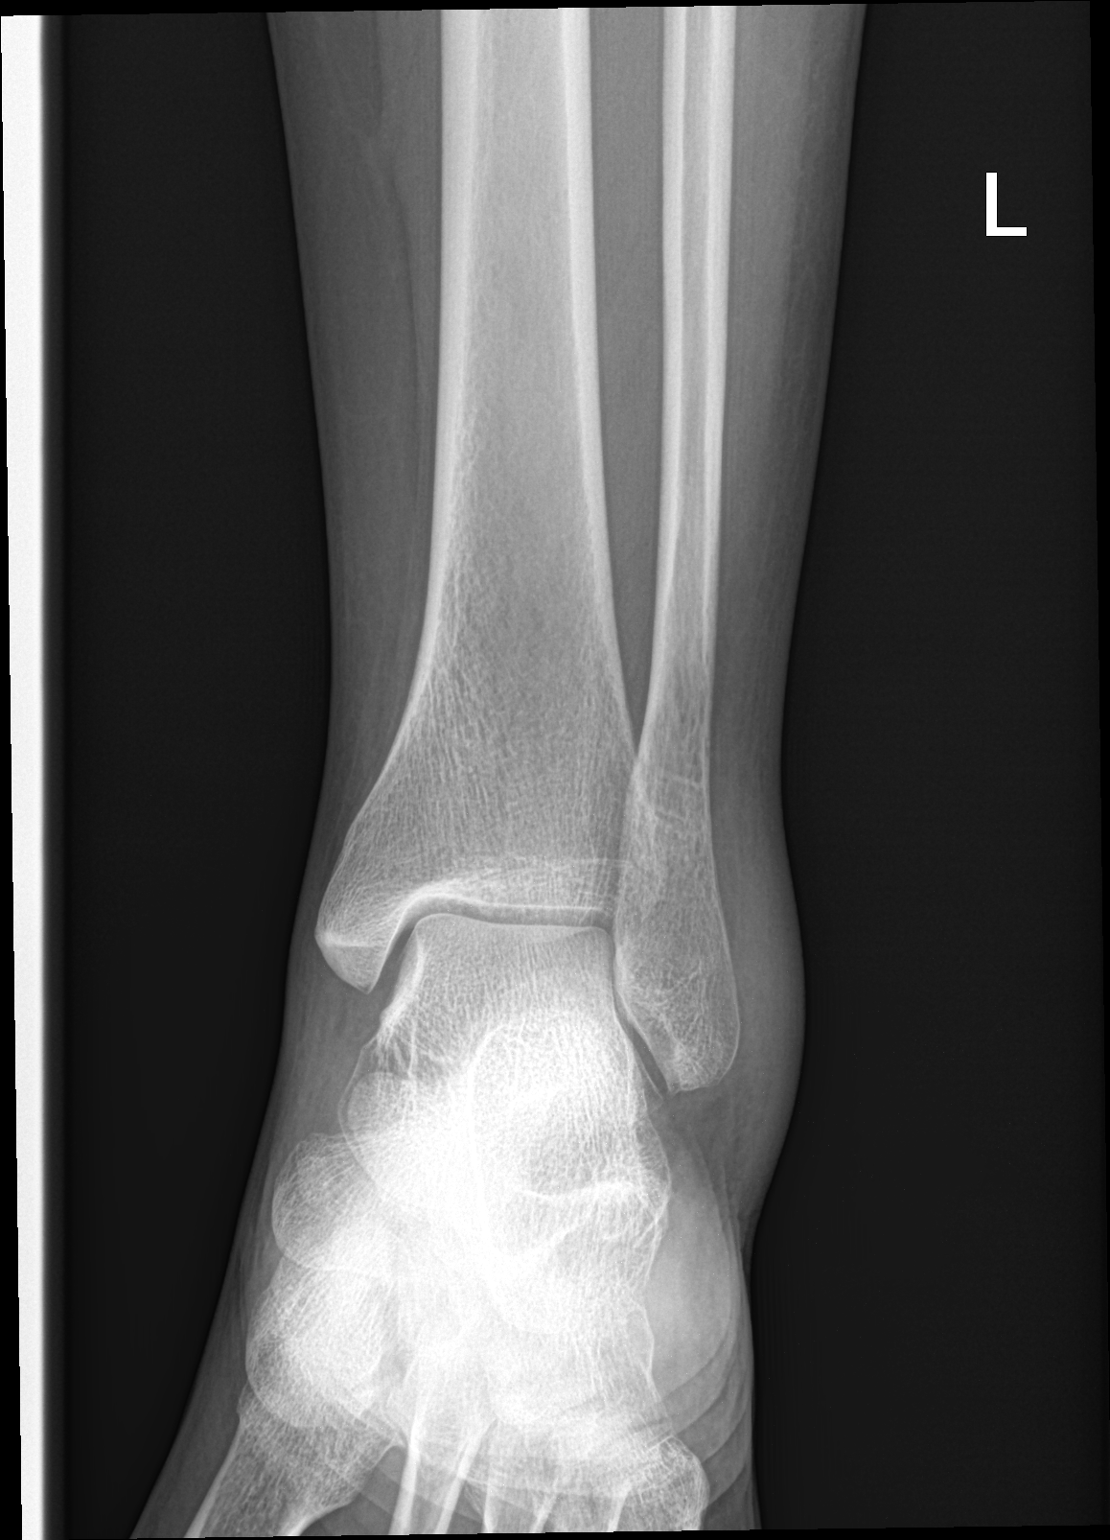

[ankle obl]
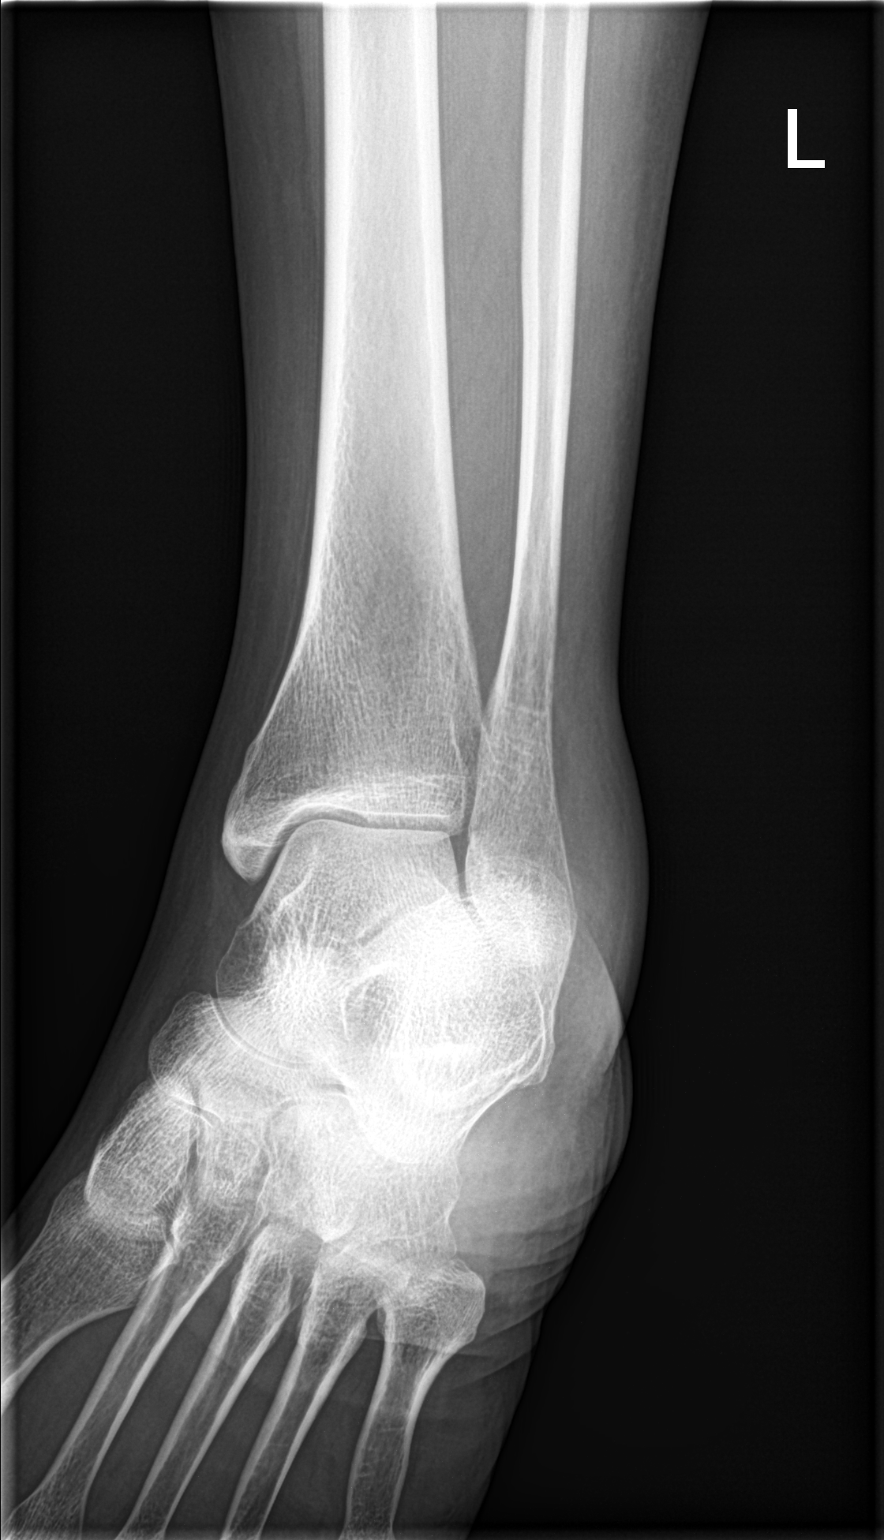

[ankle lat]
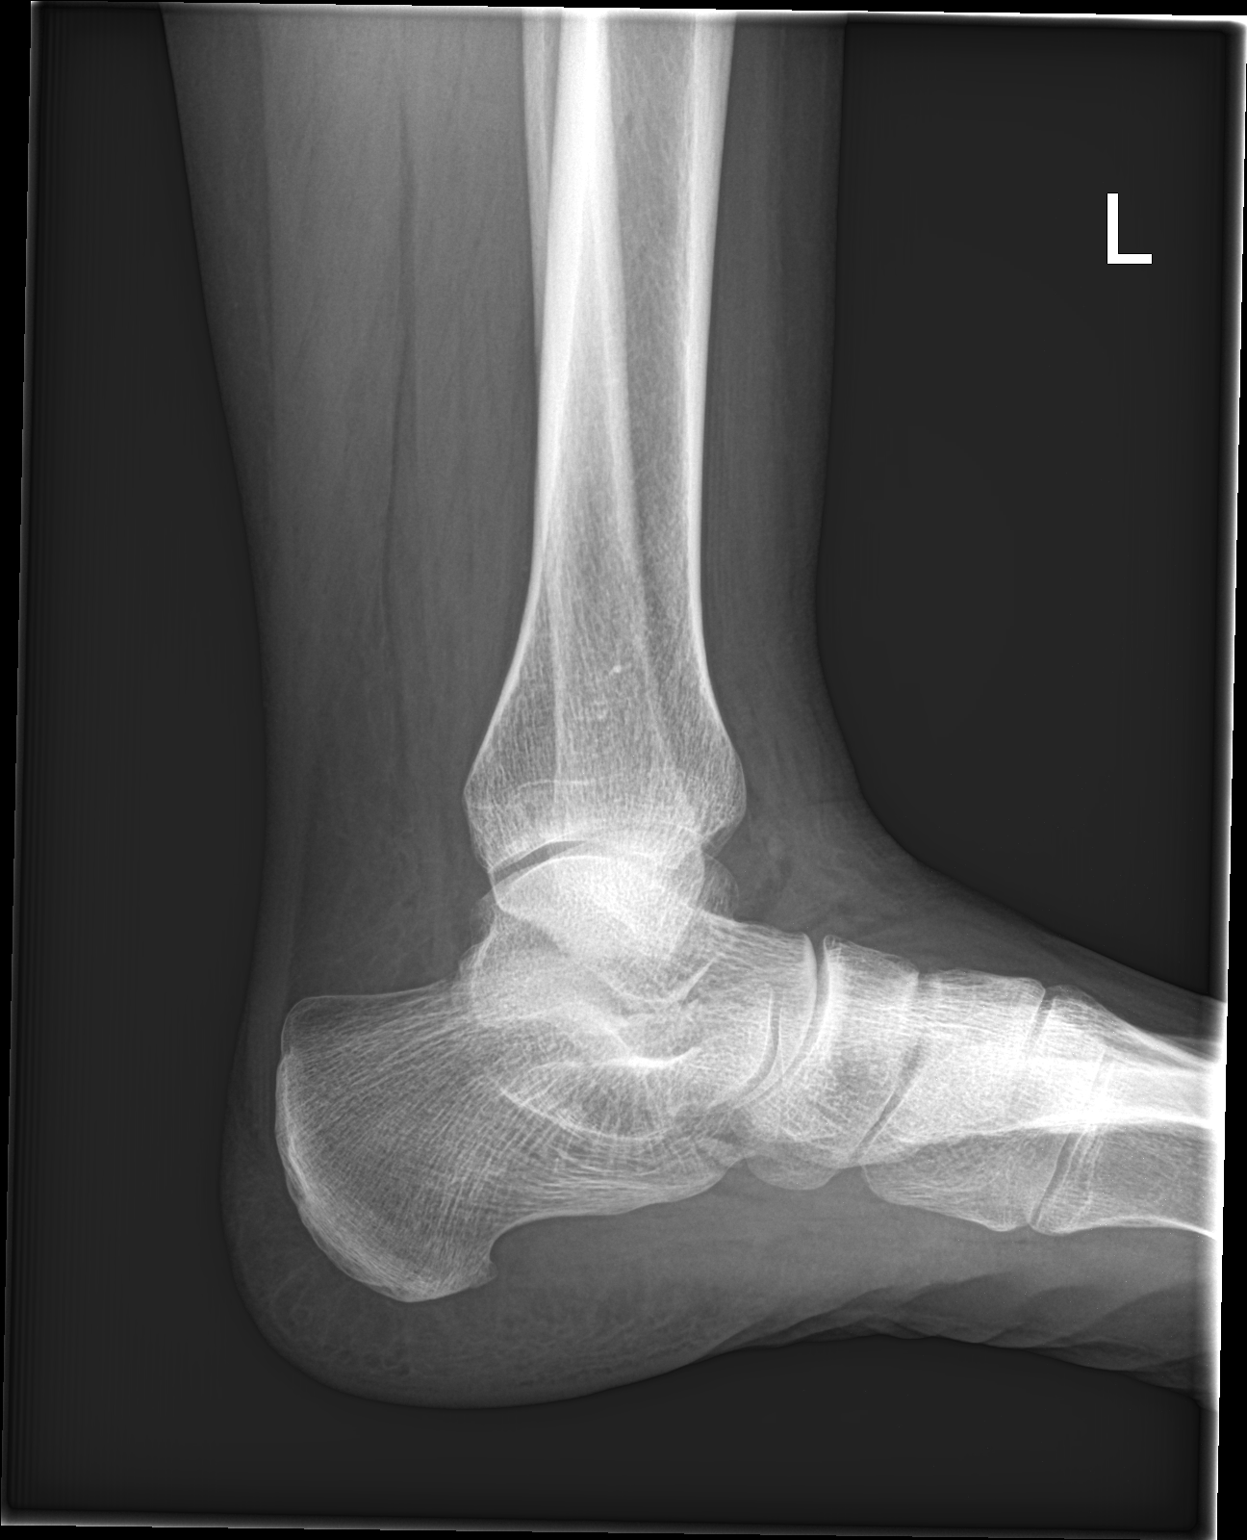

[3 of 3 positions shown; findings below may reference images not displayed]

FINDINGS: Lateral soft tissue swelling is noted. No underlying fracture or
dislocation is seen. No other focal abnormality is noted.
IMPRESSION: Soft tissue swelling without acute bony abnormality.

## 2019-08-14 ENCOUNTER — Other Ambulatory Visit: Payer: Self-pay | Admitting: *Deleted

## 2019-08-14 ENCOUNTER — Telehealth: Payer: Self-pay | Admitting: Family Medicine

## 2019-08-14 DIAGNOSIS — Z79899 Other long term (current) drug therapy: Secondary | ICD-10-CM

## 2019-08-14 DIAGNOSIS — Z13220 Encounter for screening for lipoid disorders: Secondary | ICD-10-CM

## 2019-08-14 DIAGNOSIS — R5383 Other fatigue: Secondary | ICD-10-CM

## 2019-08-14 DIAGNOSIS — E039 Hypothyroidism, unspecified: Secondary | ICD-10-CM

## 2019-08-14 NOTE — Telephone Encounter (Signed)
Pt made Phy for October the 18th does she need blood work.

## 2019-08-14 NOTE — Telephone Encounter (Signed)
Blood work ordered.

## 2019-10-15 DIAGNOSIS — Z79899 Other long term (current) drug therapy: Secondary | ICD-10-CM | POA: Diagnosis not present

## 2019-10-15 DIAGNOSIS — R5383 Other fatigue: Secondary | ICD-10-CM | POA: Diagnosis not present

## 2019-10-15 DIAGNOSIS — Z13220 Encounter for screening for lipoid disorders: Secondary | ICD-10-CM | POA: Diagnosis not present

## 2019-10-15 DIAGNOSIS — E039 Hypothyroidism, unspecified: Secondary | ICD-10-CM | POA: Diagnosis not present

## 2019-10-16 LAB — BASIC METABOLIC PANEL
BUN/Creatinine Ratio: 12 (ref 9–23)
BUN: 10 mg/dL (ref 6–24)
CO2: 24 mmol/L (ref 20–29)
Calcium: 9.8 mg/dL (ref 8.7–10.2)
Chloride: 102 mmol/L (ref 96–106)
Creatinine, Ser: 0.84 mg/dL (ref 0.57–1.00)
GFR calc Af Amer: 92 mL/min/{1.73_m2} (ref >59)
GFR calc non Af Amer: 80 mL/min/{1.73_m2} (ref >59)
Glucose: 79 mg/dL (ref 65–99)
Potassium: 4.4 mmol/L (ref 3.5–5.2)
Sodium: 141 mmol/L (ref 134–144)

## 2019-10-16 LAB — CBC WITH DIFFERENTIAL/PLATELET
Basophils Absolute: 0 10*3/uL (ref 0.0–0.2)
Basos: 0 %
EOS (ABSOLUTE): 0.1 10*3/uL (ref 0.0–0.4)
Eos: 2 %
Hematocrit: 39.9 % (ref 34.0–46.6)
Hemoglobin: 13.8 g/dL (ref 11.1–15.9)
Immature Grans (Abs): 0 10*3/uL (ref 0.0–0.1)
Immature Granulocytes: 0 %
Lymphocytes Absolute: 2.5 10*3/uL (ref 0.7–3.1)
Lymphs: 33 %
MCH: 30.9 pg (ref 26.6–33.0)
MCHC: 34.6 g/dL (ref 31.5–35.7)
MCV: 90 fL (ref 79–97)
Monocytes Absolute: 0.5 10*3/uL (ref 0.1–0.9)
Monocytes: 6 %
Neutrophils Absolute: 4.5 10*3/uL (ref 1.4–7.0)
Neutrophils: 59 %
Platelets: 318 10*3/uL (ref 150–450)
RBC: 4.46 x10E6/uL (ref 3.77–5.28)
RDW: 12.5 % (ref 11.7–15.4)
WBC: 7.7 10*3/uL (ref 3.4–10.8)

## 2019-10-16 LAB — LIPID PANEL
Chol/HDL Ratio: 4.5 ratio — ABNORMAL HIGH (ref 0.0–4.4)
Cholesterol, Total: 221 mg/dL — ABNORMAL HIGH (ref 100–199)
HDL: 49 mg/dL (ref >39)
LDL Chol Calc (NIH): 146 mg/dL — ABNORMAL HIGH (ref 0–99)
Triglycerides: 145 mg/dL (ref 0–149)
VLDL Cholesterol Cal: 26 mg/dL (ref 5–40)

## 2019-10-16 LAB — HEPATIC FUNCTION PANEL
ALT: 18 IU/L (ref 0–32)
AST: 26 IU/L (ref 0–40)
Albumin: 4.8 g/dL (ref 3.8–4.9)
Alkaline Phosphatase: 103 IU/L (ref 44–121)
Bilirubin Total: 0.3 mg/dL (ref 0.0–1.2)
Bilirubin, Direct: <0.1 mg/dL (ref 0.00–0.40)
Total Protein: 7.6 g/dL (ref 6.0–8.5)

## 2019-10-16 LAB — TSH: TSH: 1.69 u[IU]/mL (ref 0.450–4.500)

## 2019-10-21 ENCOUNTER — Encounter: Payer: Self-pay | Admitting: Family Medicine

## 2019-10-21 ENCOUNTER — Ambulatory Visit (INDEPENDENT_AMBULATORY_CARE_PROVIDER_SITE_OTHER): Payer: BC Managed Care – PPO | Admitting: Family Medicine

## 2019-10-21 VITALS — BP 134/74 | HR 77 | Temp 96.4°F | Ht 62.5 in | Wt 180.2 lb

## 2019-10-21 DIAGNOSIS — N951 Menopausal and female climacteric states: Secondary | ICD-10-CM | POA: Diagnosis not present

## 2019-10-21 DIAGNOSIS — Z01419 Encounter for gynecological examination (general) (routine) without abnormal findings: Secondary | ICD-10-CM | POA: Diagnosis not present

## 2019-10-21 DIAGNOSIS — Z23 Encounter for immunization: Secondary | ICD-10-CM | POA: Diagnosis not present

## 2019-10-21 DIAGNOSIS — E039 Hypothyroidism, unspecified: Secondary | ICD-10-CM

## 2019-10-21 DIAGNOSIS — Z124 Encounter for screening for malignant neoplasm of cervix: Secondary | ICD-10-CM

## 2019-10-21 DIAGNOSIS — E785 Hyperlipidemia, unspecified: Secondary | ICD-10-CM

## 2019-10-21 NOTE — Progress Notes (Signed)
Patient ID: Lisa Potter, female    DOB: [birth year], 54 y.o.   MRN: [id number]   Chief Complaint  Patient presents with   Annual Exam   Subjective:    HPI The patient comes in today for a wellness visit.  A review of their health history was completed.  A review of medications was also completed.  Any needed refills; Levothyroxine   Eating habits: eats pretty good   Falls/  MVA accidents in past few months: none  Regular exercise: tries to; summertime like to walk the trail   Specialist pt sees on regular basis: none  Preventative health issues were discussed.   Additional concerns: hot flashes-going on for quite a while.   Pt having hot flashes frequently. Lasting 5-10 mins. Happening at work. Tried black cohosh.  Not seeing much difference. Happening daily. Waking up at night.  mammo scheduled for November.  Medical History Marjoria has a past medical history of Headache(784.0), Hypothyroidism, Vitamin D deficiency, and Vitiligo.   Outpatient Encounter Medications as of 10/21/2019  Medication Sig   BLACK COHOSH EXTRACT PO Take by mouth.   Calcium Carbonate (CALCI-CHEW PO) Take by mouth.   clobetasol cream (TEMOVATE) 3.26 % Apply 1 application topically 2 (two) times daily.   levothyroxine (SYNTHROID) 75 MCG tablet TAKE 1 TABLET BY MOUTH EVERY DAY.EXCEPT WEDNESDAY AND SATURDAY, TAKE 0.5 TABLET   OVER THE COUNTER MEDICATION Vit d 2,000 iu   No facility-administered encounter medications on file as of 10/21/2019.     Review of Systems  Constitutional: Negative for chills and fever.  HENT: Negative for congestion, rhinorrhea and sore throat.   Respiratory: Negative for cough, shortness of breath and wheezing.   Cardiovascular: Negative for chest pain and leg swelling.  Gastrointestinal: Negative for abdominal pain, diarrhea, nausea and vomiting.  Genitourinary: Negative for dysuria and frequency.  Musculoskeletal: Negative for arthralgias and back  pain.  Skin: Negative for rash.  Neurological: Negative for dizziness, weakness and headaches.       +hot flashes    Vitals BP 134/74    Pulse 77    Temp (!) 96.4 F (35.8 C)    Ht 5' 2.5" (1.588 m)    Wt 180 lb 3.2 oz (81.7 kg)    LMP 09/05/2013    SpO2 100%    BMI 32.43 kg/m   Objective:   Physical Exam Vitals and nursing note reviewed. Exam conducted with a chaperone present.  Constitutional:      General: She is not in acute distress.    Appearance: Normal appearance. She is not ill-appearing.  HENT:     Head: Normocephalic and atraumatic.     Right Ear: Tympanic membrane, ear canal and external ear normal.     Left Ear: Tympanic membrane, ear canal and external ear normal.     Nose: Nose normal.     Mouth/Throat:     Mouth: Mucous membranes are moist.     Pharynx: Oropharynx is clear.  Eyes:     Extraocular Movements: Extraocular movements intact.     Conjunctiva/sclera: Conjunctivae normal.     Pupils: Pupils are equal, round, and reactive to light.  Cardiovascular:     Rate and Rhythm: Normal rate and regular rhythm.     Pulses: Normal pulses.     Heart sounds: Normal heart sounds. No murmur heard.   Pulmonary:     Effort: Pulmonary effort is normal. No respiratory distress.     Breath sounds: Normal breath  sounds. No wheezing, rhonchi or rales.  Chest:     Chest wall: No mass.     Breasts: Breasts are symmetrical.        Right: Normal. No swelling, bleeding, mass, nipple discharge, skin change or tenderness.        Left: Normal. No swelling, bleeding, mass, nipple discharge, skin change or tenderness.  Abdominal:     General: Abdomen is flat. Bowel sounds are normal. There is no distension.     Palpations: Abdomen is soft. There is no mass.     Tenderness: There is no abdominal tenderness. There is no guarding or rebound.     Hernia: No hernia is present.  Genitourinary:    General: Normal vulva.     Vagina: No vaginal discharge.  Musculoskeletal:         General: Normal range of motion.     Cervical back: Normal range of motion.     Right lower leg: No edema.     Left lower leg: No edema.  Lymphadenopathy:     Upper Body:     Right upper body: No supraclavicular, axillary or pectoral adenopathy.     Left upper body: No supraclavicular, axillary or pectoral adenopathy.  Skin:    General: Skin is warm and dry.     Findings: No lesion or rash.  Neurological:     General: No focal deficit present.     Mental Status: She is alert and oriented to person, place, and time.     Cranial Nerves: No cranial nerve deficit.  Psychiatric:        Mood and Affect: Mood normal.        Behavior: Behavior normal.        Thought Content: Thought content normal.        Judgment: Judgment normal.      Assessment and Plan   1. Well woman exam with routine gynecological exam  2. Flu vaccine need - Flu Vaccine QUAD 6+ mos PF IM (Fluarix Quad PF)  3. Screening for cervical cancer - IGP, Aptima HPV  4. Hot flash, menopausal  5. Hypothyroidism, unspecified type  6. Hyperlipidemia, unspecified hyperlipidemia type   Well woman exam- pap and breast exam completed.   Not ready to take a medication for hot flashes at this time. Offered gabapentin or an SSRI like effexor, pt declining at this time. Pt wanting to continue with black cohosh.  Pt stating will call to make appt for mammo for nov.   Hypothyroidism-stable. Cont meds.  HLD- slight elevation in LDL.  Good HDL.  Will cont to monitor. Cont to watch cholesterol and eat low fat/cholesterol diet.  Increase in exercising.    F/u 1 yr or prn

## 2019-10-26 LAB — IGP, APTIMA HPV: HPV Aptima: NEGATIVE

## 2019-11-03 ENCOUNTER — Encounter: Payer: Self-pay | Admitting: Family Medicine

## 2019-11-03 DIAGNOSIS — N951 Menopausal and female climacteric states: Secondary | ICD-10-CM | POA: Insufficient documentation

## 2019-11-03 DIAGNOSIS — E785 Hyperlipidemia, unspecified: Secondary | ICD-10-CM | POA: Insufficient documentation

## 2019-11-06 ENCOUNTER — Other Ambulatory Visit: Payer: Self-pay | Admitting: *Deleted

## 2019-11-06 MED ORDER — LEVOTHYROXINE SODIUM 75 MCG PO TABS: ORAL_TABLET | ORAL | 1 refills | Status: DC

## 2019-11-07 IMAGING — MG DIGITAL DIAGNOSTIC UNILATERAL RIGHT MAMMOGRAM WITH TOMO AND CAD
4 series · 4 of 12 positions shown · non-contrast
Comparison: Previous exam(s).

CLINICAL DATA: Screening recall for asymmetry seen in the far
posterior right breast on the MLO view only.

EXAM:
DIGITAL DIAGNOSTIC UNILATERAL RIGHT MAMMOGRAM WITH CAD AND TOMO
RIGHT BREAST ULTRASOUND

[R MLO synth-2D]
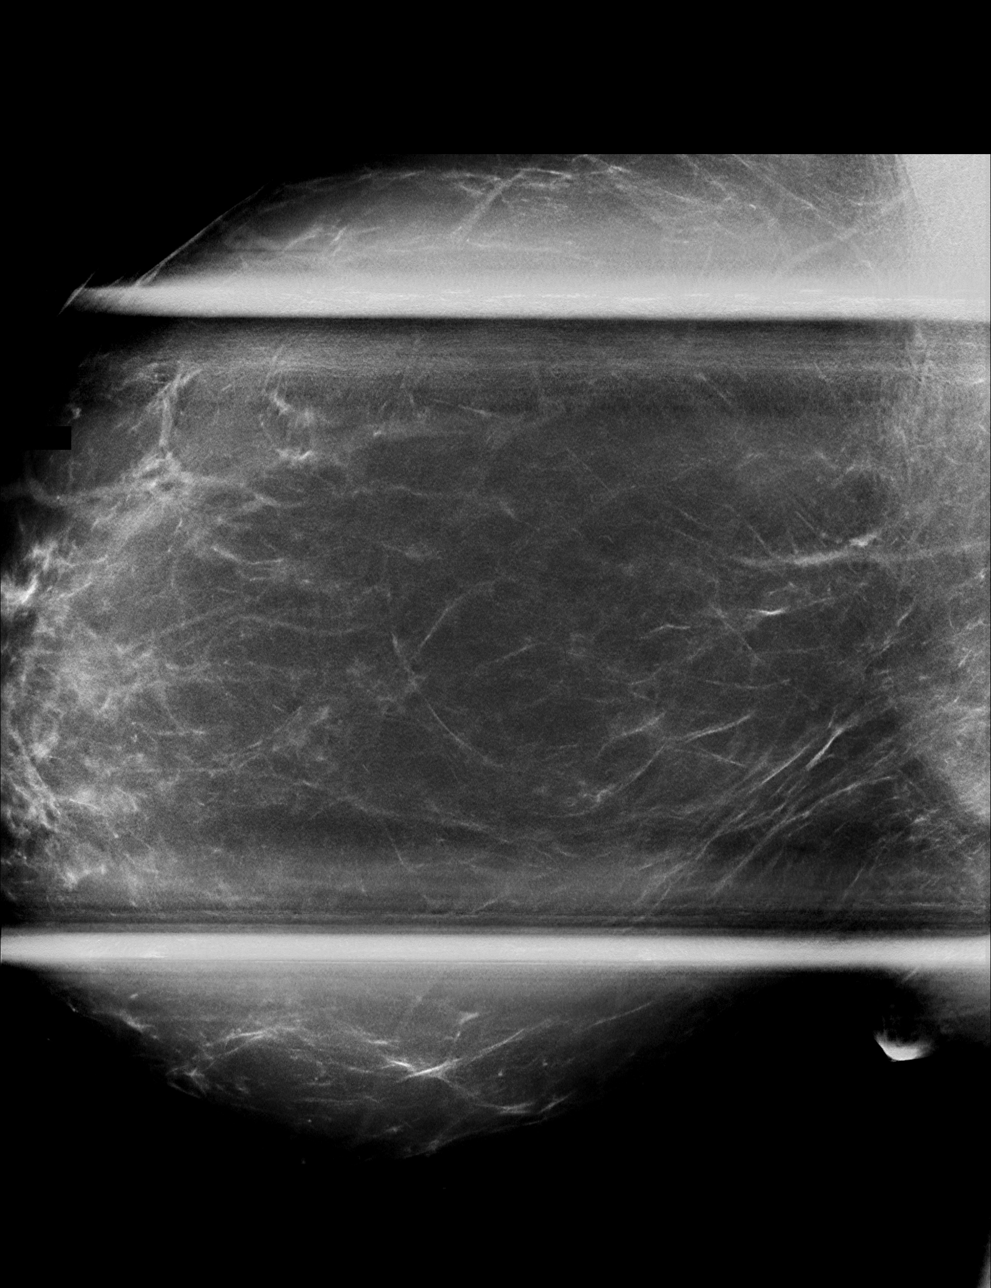

[R ML synth-2D]
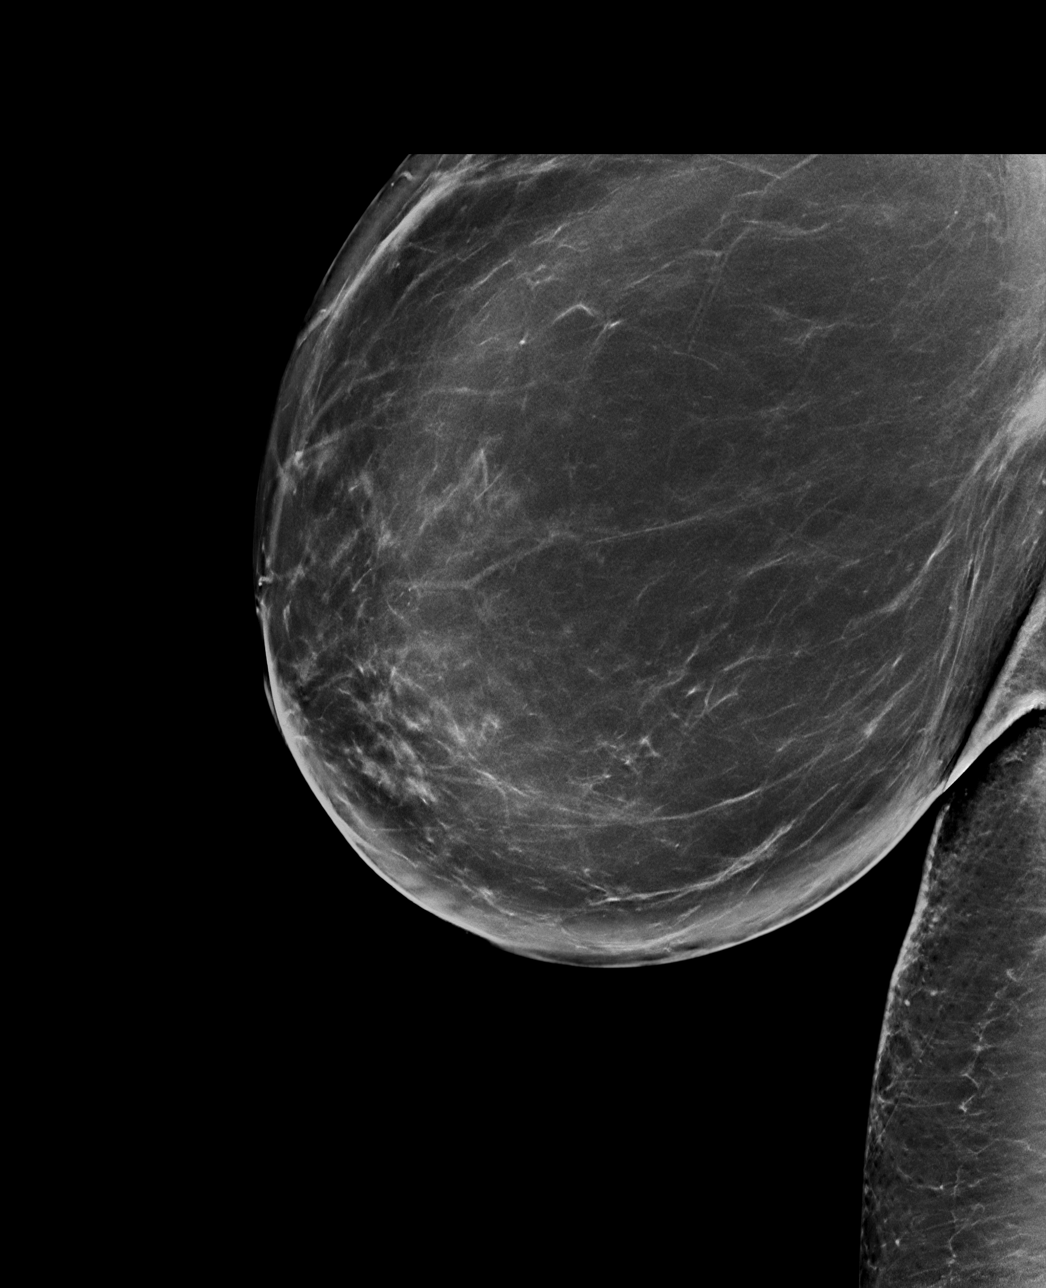

[R ML tomo · tomo slice 49/97.0]
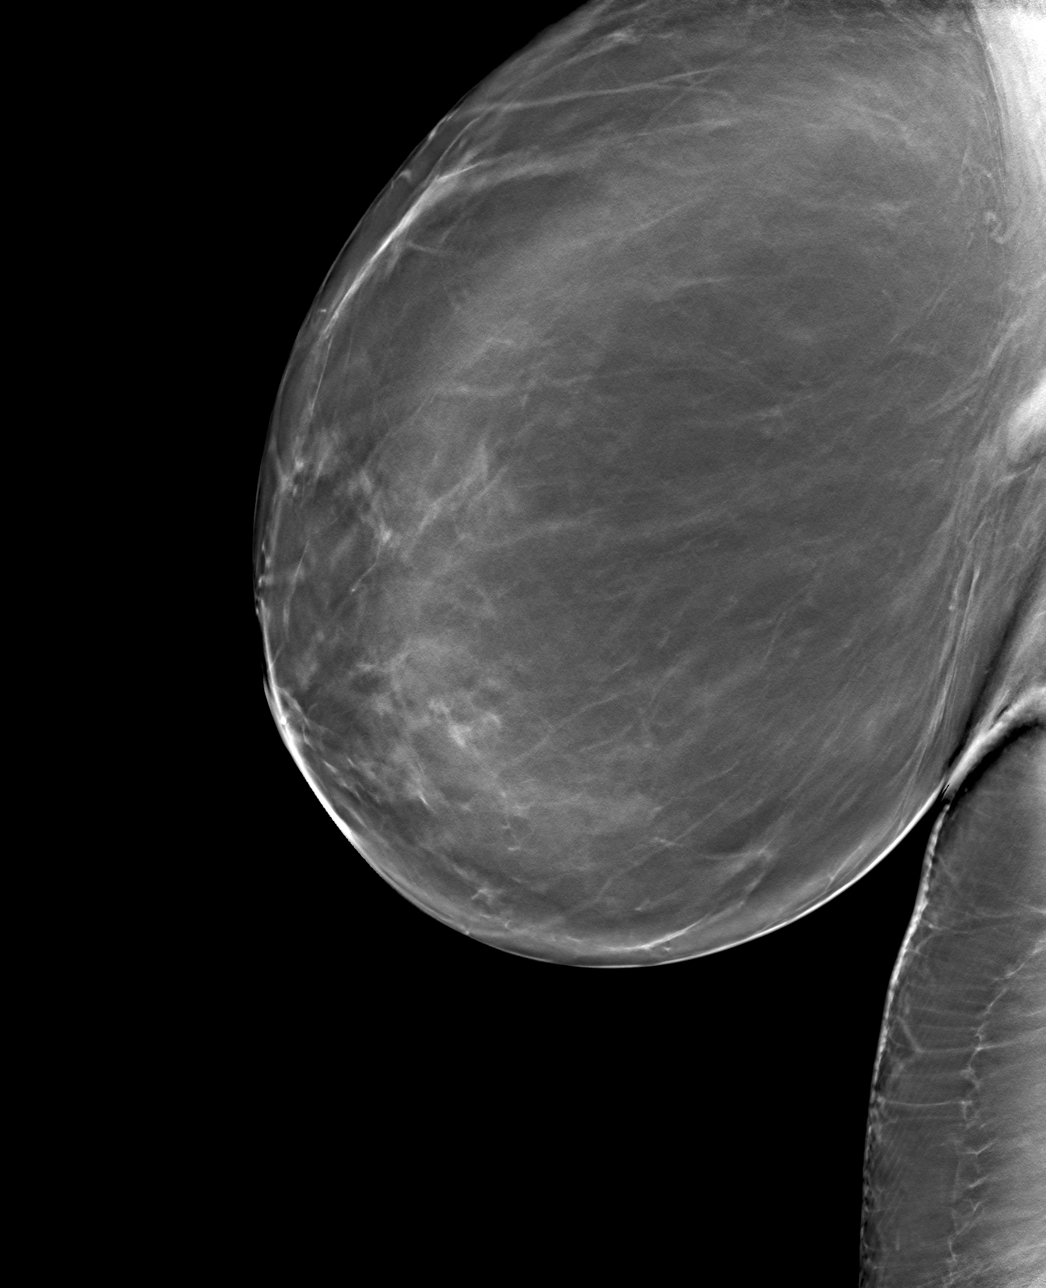

[R MLO tomo · tomo slice 49/96.0]
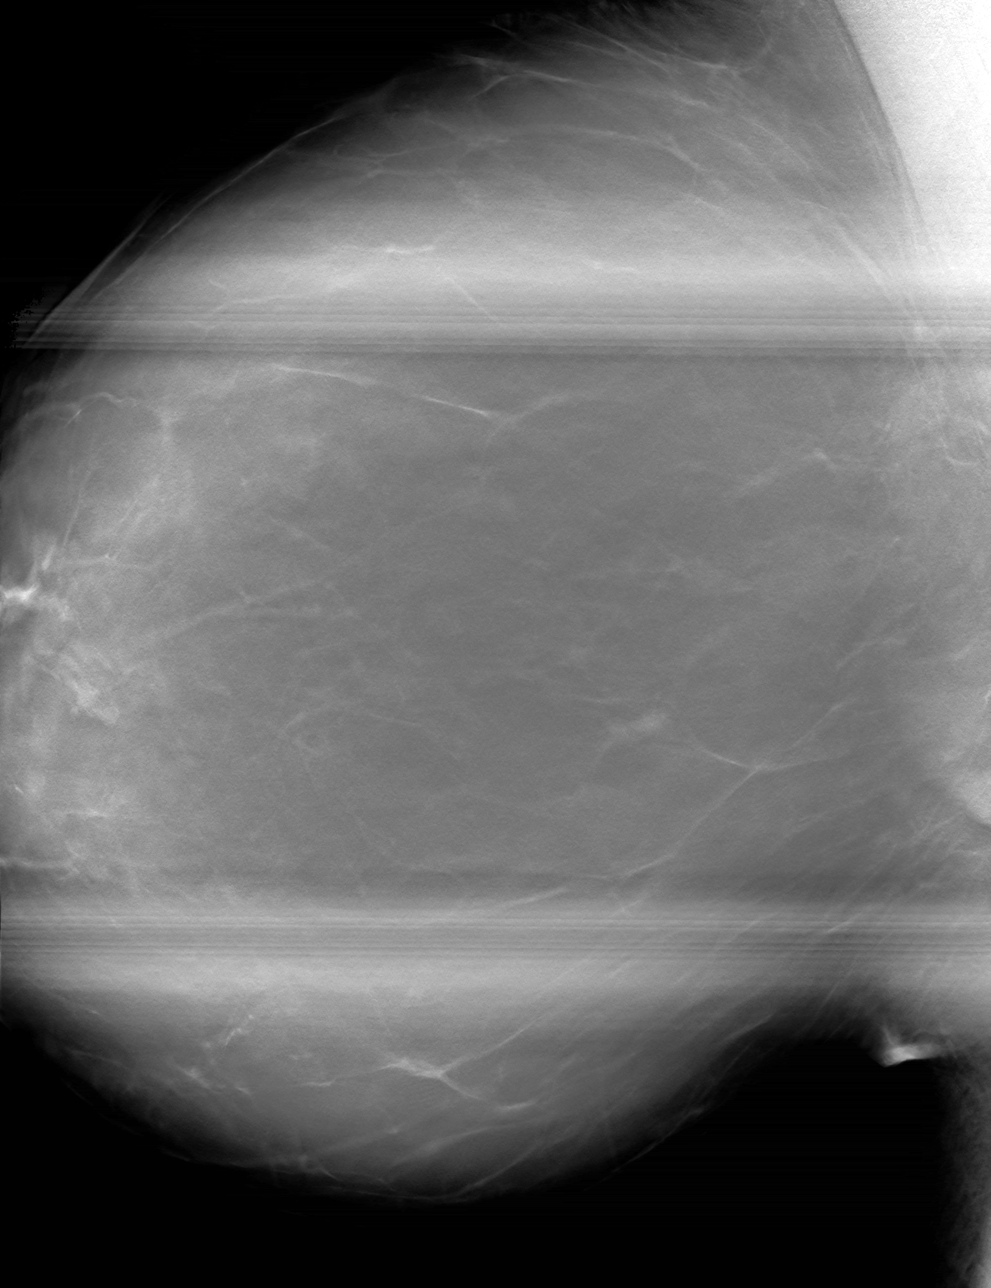

[4 of 12 positions shown; findings below may reference images not displayed]

ACR Breast Density Category b: There are scattered areas of
fibroglandular density.
FINDINGS: Spot compression MLO tomograms and mL tomograms were performed of
the right breast. The initially questioned right breast asymmetry
persists, although may be related to the inferior edge of the
pectoralis muscle.

Mammographic images were processed with CAD.

Targeted ultrasound of the entire lower and central posterior right
breast was performed. No suspicious masses or abnormalities
identified, only normal appearing fibroglandular tissue seen.
IMPRESSION: Probably benign asymmetry seen in the right breast on mammography
only.

RECOMMENDATION:
Diagnostic mammography of the right breast in 6 months with possible
ultrasound.

I have discussed the findings and recommendations with the patient.
Results were also provided in writing at the conclusion of the
visit. If applicable, a reminder letter will be sent to the patient
regarding the next appointment.

BI-RADS CATEGORY  3: Probably benign.

## 2019-11-26 ENCOUNTER — Other Ambulatory Visit (HOSPITAL_COMMUNITY): Payer: Self-pay | Admitting: Family Medicine

## 2019-11-26 DIAGNOSIS — Z1231 Encounter for screening mammogram for malignant neoplasm of breast: Secondary | ICD-10-CM

## 2019-12-02 ENCOUNTER — Ambulatory Visit (HOSPITAL_COMMUNITY)
Admission: RE | Admit: 2019-12-02 | Discharge: 2019-12-02 | Disposition: A | Discharge status: Home / Self Care | Payer: BC Managed Care – PPO | Source: Ambulatory Visit | Attending: Family Medicine | Admitting: Family Medicine

## 2019-12-02 ENCOUNTER — Other Ambulatory Visit: Payer: Self-pay

## 2019-12-02 DIAGNOSIS — Z1231 Encounter for screening mammogram for malignant neoplasm of breast: Secondary | ICD-10-CM | POA: Diagnosis not present

## 2020-03-02 ENCOUNTER — Other Ambulatory Visit: Payer: Self-pay | Admitting: Family Medicine

## 2020-03-02 NOTE — Telephone Encounter (Signed)
Lab Results  Component Value Date   TSH 1.690 10/15/2019

## 2020-09-29 ENCOUNTER — Encounter (INDEPENDENT_AMBULATORY_CARE_PROVIDER_SITE_OTHER): Payer: Self-pay | Admitting: *Deleted

## 2020-10-21 ENCOUNTER — Ambulatory Visit (INDEPENDENT_AMBULATORY_CARE_PROVIDER_SITE_OTHER): Payer: BC Managed Care – PPO | Admitting: Family Medicine

## 2020-10-21 ENCOUNTER — Encounter: Payer: Self-pay | Admitting: Family Medicine

## 2020-10-21 ENCOUNTER — Other Ambulatory Visit: Payer: Self-pay

## 2020-10-21 VITALS — BP 120/70 | HR 64 | Temp 97.2°F | Ht 62.0 in | Wt 177.6 lb

## 2020-10-21 DIAGNOSIS — Z0000 Encounter for general adult medical examination without abnormal findings: Secondary | ICD-10-CM | POA: Diagnosis not present

## 2020-10-21 DIAGNOSIS — E785 Hyperlipidemia, unspecified: Secondary | ICD-10-CM | POA: Diagnosis not present

## 2020-10-21 DIAGNOSIS — Z23 Encounter for immunization: Secondary | ICD-10-CM

## 2020-10-21 DIAGNOSIS — E039 Hypothyroidism, unspecified: Secondary | ICD-10-CM | POA: Diagnosis not present

## 2020-10-21 DIAGNOSIS — Z1211 Encounter for screening for malignant neoplasm of colon: Secondary | ICD-10-CM

## 2020-10-21 DIAGNOSIS — E669 Obesity, unspecified: Secondary | ICD-10-CM

## 2020-10-21 DIAGNOSIS — Z130 Encounter for screening for diseases of the blood and blood-forming organs and certain disorders involving the immune mechanism: Secondary | ICD-10-CM

## 2020-10-21 MED ORDER — CLOBETASOL PROPIONATE 0.05 % EX CREA: 1.0000 "application " | TOPICAL_CREAM | Freq: Two times a day (BID) | CUTANEOUS | 5 refills | Status: DC

## 2020-10-21 MED ORDER — LEVOTHYROXINE SODIUM 75 MCG PO TABS: ORAL_TABLET | ORAL | 1 refills | Status: DC

## 2020-10-21 NOTE — Assessment & Plan Note (Addendum)
Mammogram next month. Pap smear up to date. Will proceed with cologuard as insurance not paying for colonoscopy sooner than 10 years. Flu shot today.  Discussed shingles vaccine. Can get at pharmacy.  Waiting on Tetanus.  Meds refilled.

## 2020-10-21 NOTE — Progress Notes (Signed)
Subjective:  Patient ID: Lisa Potter, female    DOB: April 08, 1965  Age: 55 y.o. MRN: [id number]  CC: Physical, Medication refill  HPI Lisa Potter is a 55 y.o. female presents to the clinic today for an annual physical exam.  Preventative Healthcare Pap smear: Up to date. Mammogram: Upcoming Mammogram next month. Colonoscopy: In need of follow up Colonoscopy this year. Patient states that insurance will not pay for it. Would like Cologuard. Immunizations Tetanus - Due. Flu - Wants today. Shingles - Due. Hepatitis C screening - Declines. Labs: In need of labs.  Patient reports that she needs refills on her Synthroid and Clobetasol.  No other concerns at this time.   PMH, Surgical Hx, Family Hx, Social History reviewed and updated as below.  Past Medical History:  Diagnosis Date   Headache(784.0)    Hypothyroidism    Vitamin D deficiency    Vitiligo    Past Surgical History:  Procedure Laterality Date   COLONOSCOPY  10/06/2010   Procedure: COLONOSCOPY;  Surgeon: Rogene Houston, MD;  Location: [facility];  Service: Endoscopy;  Laterality: N/A;   COLONOSCOPY N/A 10/07/2015   Procedure: COLONOSCOPY;  Surgeon: Rogene Houston, MD;  Location: [facility];  Service: Endoscopy;  Laterality: N/A;  100   DILATION AND CURETTAGE OF UTERUS     Family History  Problem Relation Age of Onset   Colon cancer Sister    Hypothyroidism Sister    Hypertension Mother    Diabetes Mother    Social History   Tobacco Use   Smoking status: Never   Smokeless tobacco: Never  Substance Use Topics   Alcohol use: No    Review of Systems  Constitutional: Negative.   Genitourinary: Negative.   All other systems reviewed and are negative.  Objective:   Today's Vitals: BP 120/70   Pulse 64   Temp (!) 97.2 F (36.2 C)   Ht 5\' 2"  (1.575 m)   Wt 177 lb 9.6 oz (80.6 kg)   LMP 09/05/2013   SpO2 97%   BMI 32.48 kg/m   Physical Exam Vitals and nursing note reviewed.   Constitutional:      General: She is not in acute distress.    Appearance: Normal appearance. She is obese. She is not ill-appearing.  HENT:     Head: Normocephalic and atraumatic.     Right Ear: Tympanic membrane normal.     Left Ear: Tympanic membrane normal.     Mouth/Throat:     Pharynx: Oropharynx is clear. No oropharyngeal exudate or posterior oropharyngeal erythema.  Eyes:     General:        Right eye: No discharge.        Left eye: No discharge.     Conjunctiva/sclera: Conjunctivae normal.  Cardiovascular:     Rate and Rhythm: Normal rate and regular rhythm.     Heart sounds: No murmur heard. Pulmonary:     Effort: Pulmonary effort is normal.     Breath sounds: Normal breath sounds. No wheezing, rhonchi or rales.  Abdominal:     General: There is no distension.     Palpations: Abdomen is soft.     Tenderness: There is no abdominal tenderness.  Musculoskeletal:     Cervical back: Neck supple.  Lymphadenopathy:     Cervical: No cervical adenopathy.  Skin:    Comments: Left thumb with dry, cracked skin.  Neurological:     Mental Status: She is alert.  Psychiatric:        Mood and Affect: Mood normal.        Behavior: Behavior normal.     Assessment & Plan:   Problem List Items Addressed This Visit       Endocrine   Hypothyroidism   Relevant Medications   levothyroxine (SYNTHROID) 75 MCG tablet   Other Relevant Orders   TSH     Other   Hyperlipidemia   Relevant Orders   Lipid panel   Physical exam, annual - Primary    Mammogram next month. Pap smear up to date. Will proceed with cologuard as insurance not paying for colonoscopy sooner than 10 years. Flu shot today.  Discussed shingles vaccine. Can get at pharmacy.  Waiting on Tetanus.  Meds refilled.       Other Visit Diagnoses     Obesity (BMI 30.0-34.9)       Relevant Orders   Comprehensive metabolic panel   Screening for deficiency anemia       Relevant Orders   CBC   Colon cancer  screening       Relevant Orders   Cologuard   Need for vaccination       Relevant Orders   Flu Vaccine QUAD 6+ mos PF IM (Fluarix Quad PF) (Completed)       Meds ordered this encounter  Medications   levothyroxine (SYNTHROID) 75 MCG tablet    Sig: TAKE 1 TABLET BY MOUTH EVERY DAY. EXCEPT ON WEDNESDAY AND SATURDAY, TAKE 0.5 TABLET ON WEDNESDAY AND SATURDAY    Dispense:  90 tablet    Refill:  1   clobetasol cream (TEMOVATE) 0.05 %    Sig: Apply 1 application topically 2 (two) times daily.    Dispense:  30 g    Refill:  5     Follow-up: Annually  [place]

## 2020-10-21 NOTE — Patient Instructions (Signed)
Labs at your convenience (fasting).  I have refilled your medications and placed order for cologuard.  Follow up annually or sooner if needed.  Take care  Dr. Lacinda Axon

## 2020-10-22 DIAGNOSIS — E039 Hypothyroidism, unspecified: Secondary | ICD-10-CM | POA: Diagnosis not present

## 2020-10-22 DIAGNOSIS — E669 Obesity, unspecified: Secondary | ICD-10-CM | POA: Diagnosis not present

## 2020-10-22 DIAGNOSIS — E785 Hyperlipidemia, unspecified: Secondary | ICD-10-CM | POA: Diagnosis not present

## 2020-10-22 DIAGNOSIS — Z130 Encounter for screening for diseases of the blood and blood-forming organs and certain disorders involving the immune mechanism: Secondary | ICD-10-CM | POA: Diagnosis not present

## 2020-10-23 LAB — CBC
Hematocrit: 44.3 % (ref 34.0–46.6)
Hemoglobin: 14.8 g/dL (ref 11.1–15.9)
MCH: 30.2 pg (ref 26.6–33.0)
MCHC: 33.4 g/dL (ref 31.5–35.7)
MCV: 90 fL (ref 79–97)
Platelets: 347 10*3/uL (ref 150–450)
RBC: 4.9 x10E6/uL (ref 3.77–5.28)
RDW: 13.1 % (ref 11.7–15.4)
WBC: 7 10*3/uL (ref 3.4–10.8)

## 2020-10-23 LAB — COMPREHENSIVE METABOLIC PANEL
ALT: 19 IU/L (ref 0–32)
AST: 29 IU/L (ref 0–40)
Albumin/Globulin Ratio: 1.6 (ref 1.2–2.2)
Albumin: 5 g/dL — ABNORMAL HIGH (ref 3.8–4.9)
Alkaline Phosphatase: 131 IU/L — ABNORMAL HIGH (ref 44–121)
BUN/Creatinine Ratio: 8 — ABNORMAL LOW (ref 9–23)
BUN: 9 mg/dL (ref 6–24)
Bilirubin Total: 0.5 mg/dL (ref 0.0–1.2)
CO2: 21 mmol/L (ref 20–29)
Calcium: 9.7 mg/dL (ref 8.7–10.2)
Chloride: 103 mmol/L (ref 96–106)
Creatinine, Ser: 1.09 mg/dL — ABNORMAL HIGH (ref 0.57–1.00)
Globulin, Total: 3.1 g/dL (ref 1.5–4.5)
Glucose: 88 mg/dL (ref 70–99)
Potassium: 4.1 mmol/L (ref 3.5–5.2)
Sodium: 141 mmol/L (ref 134–144)
Total Protein: 8.1 g/dL (ref 6.0–8.5)
eGFR: 60 mL/min/{1.73_m2} (ref >59)

## 2020-10-23 LAB — LIPID PANEL
Chol/HDL Ratio: 3.5 ratio (ref 0.0–4.4)
Cholesterol, Total: 207 mg/dL — ABNORMAL HIGH (ref 100–199)
HDL: 59 mg/dL (ref >39)
LDL Chol Calc (NIH): 124 mg/dL — ABNORMAL HIGH (ref 0–99)
Triglycerides: 136 mg/dL (ref 0–149)
VLDL Cholesterol Cal: 24 mg/dL (ref 5–40)

## 2020-10-23 LAB — TSH: TSH: 4.25 u[IU]/mL (ref 0.450–4.500)

## 2020-11-11 DIAGNOSIS — Z1211 Encounter for screening for malignant neoplasm of colon: Secondary | ICD-10-CM | POA: Diagnosis not present

## 2020-11-19 LAB — COLOGUARD: COLOGUARD: NEGATIVE

## 2020-12-03 ENCOUNTER — Other Ambulatory Visit (HOSPITAL_COMMUNITY): Payer: Self-pay | Admitting: Family Medicine

## 2020-12-03 DIAGNOSIS — Z1231 Encounter for screening mammogram for malignant neoplasm of breast: Secondary | ICD-10-CM

## 2020-12-14 ENCOUNTER — Ambulatory Visit (HOSPITAL_COMMUNITY)
Admission: RE | Admit: 2020-12-14 | Discharge: 2020-12-14 | Disposition: A | Discharge status: Home / Self Care | Payer: BC Managed Care – PPO | Source: Ambulatory Visit | Attending: Family Medicine | Admitting: Family Medicine

## 2020-12-14 ENCOUNTER — Other Ambulatory Visit: Payer: Self-pay

## 2020-12-14 DIAGNOSIS — Z1231 Encounter for screening mammogram for malignant neoplasm of breast: Secondary | ICD-10-CM | POA: Diagnosis not present

## 2021-04-15 ENCOUNTER — Other Ambulatory Visit: Payer: Self-pay | Admitting: Family Medicine

## 2021-06-28 ENCOUNTER — Other Ambulatory Visit: Payer: Self-pay

## 2021-06-28 MED ORDER — LEVOTHYROXINE SODIUM 75 MCG PO TABS: ORAL_TABLET | ORAL | 0 refills | Status: DC

## 2021-10-26 ENCOUNTER — Ambulatory Visit (INDEPENDENT_AMBULATORY_CARE_PROVIDER_SITE_OTHER): Payer: BC Managed Care – PPO | Admitting: Family Medicine

## 2021-10-26 VITALS — BP 162/82 | HR 62 | Temp 97.3°F | Ht 62.0 in | Wt 176.0 lb

## 2021-10-26 DIAGNOSIS — E039 Hypothyroidism, unspecified: Secondary | ICD-10-CM | POA: Diagnosis not present

## 2021-10-26 DIAGNOSIS — Z130 Encounter for screening for diseases of the blood and blood-forming organs and certain disorders involving the immune mechanism: Secondary | ICD-10-CM

## 2021-10-26 DIAGNOSIS — Z0000 Encounter for general adult medical examination without abnormal findings: Secondary | ICD-10-CM | POA: Diagnosis not present

## 2021-10-26 DIAGNOSIS — Z1159 Encounter for screening for other viral diseases: Secondary | ICD-10-CM | POA: Diagnosis not present

## 2021-10-26 DIAGNOSIS — Z23 Encounter for immunization: Secondary | ICD-10-CM

## 2021-10-26 DIAGNOSIS — E785 Hyperlipidemia, unspecified: Secondary | ICD-10-CM

## 2021-10-26 DIAGNOSIS — R7989 Other specified abnormal findings of blood chemistry: Secondary | ICD-10-CM | POA: Diagnosis not present

## 2021-10-26 MED ORDER — CLOBETASOL PROPIONATE 0.05 % EX CREA: 1.0000 | TOPICAL_CREAM | Freq: Two times a day (BID) | CUTANEOUS | 5 refills | Status: DC

## 2021-10-26 NOTE — Assessment & Plan Note (Signed)
Patient is doing well.  Blood pressure is elevated here today.  Was elevated on repeat as well.  This is out of the norm for the patient.  She will check her blood pressures at home.  Labs today include hepatitis C screening.  Flu vaccine given today.  Patient will consider shingles vaccine as well as tetanus at her pharmacy.  She will consider getting COVID booster as well. Colon cancer screening up-to-date.  Mammogram up-to-date.

## 2021-10-26 NOTE — Progress Notes (Signed)
Subjective:  Patient ID: Lisa Potter, female    DOB: [birth year]  Age: 56 y.o. MRN: [id number]  CC: Chief Complaint  Patient presents with   Annual Exam    Labs thyroid     HPI:  56 year old female with hypothyroidism, psoriasis, and hyperlipidemia presents for an annual exam.  Patient states that she is doing well.  She states that she has recently had some numbness in the plantar aspect of her foot.  She attributes this to her new work boots.  She is otherwise doing well.  Preventative Healthcare Pap smear: Up to date. Mammogram: Up to date.  Colonoscopy: Has had a recent Cologuard.  Her insurance company would not cover her colonoscopy that was needed after 5 years Immunizations Tetanus - In need of. Pneumococcal - Not indicated at this time.  Flu - Desires today. Shingles - Will consider getting at pharmacy.  Hepatitis C screening - Okay with screening today. Labs: Needs labs.  Alcohol use: No. Smoking/tobacco use: No. STD/HIV testing: HIV screening done.  Patient Active Problem List   Diagnosis Date Noted   Physical exam, annual 10/21/2020   Hyperlipidemia 11/03/2019   Hypothyroidism 07/18/2012   Psoriasis 07/18/2012    Social Hx   Social History   Socioeconomic History   Marital status: Married    Spouse name: Not on file   Number of children: Not on file   Years of education: Not on file   Highest education level: Not on file  Occupational History   Not on file  Tobacco Use   Smoking status: Never   Smokeless tobacco: Never  Substance and Sexual Activity   Alcohol use: No   Drug use: No   Sexual activity: Yes  Other Topics Concern   Not on file  Social History Narrative   Not on file   Social Determinants of Health   Financial Resource Strain: Not on file  Food Insecurity: Not on file  Transportation Needs: Not on file  Physical Activity: Not on file  Stress: Not on file  Social Connections: Not on file    Review of Systems Per  HPI  Objective:  BP (!) 162/82   Pulse 62   Temp (!) 97.3 F (36.3 C)   Ht '5\' 2"'  (1.575 m)   Wt 176 lb (79.8 kg)   LMP 09/05/2013   SpO2 100%   BMI 32.19 kg/m      10/26/2021    8:52 AM 10/21/2020    9:20 AM 10/21/2019    9:22 AM  BP/Weight  Systolic BP 782 956 213  Diastolic BP 82 70 74  Wt. (Lbs) 176 177.6 180.2  BMI 32.19 kg/m2 32.48 kg/m2 32.43 kg/m2    Physical Exam Vitals and nursing note reviewed.  Constitutional:      General: She is not in acute distress.    Appearance: Normal appearance.  HENT:     Head: Normocephalic and atraumatic.     Right Ear: Tympanic membrane normal.     Left Ear: Tympanic membrane normal.     Nose: Nose normal.     Mouth/Throat:     Pharynx: Oropharynx is clear.  Eyes:     General:        Right eye: No discharge.        Left eye: No discharge.     Conjunctiva/sclera: Conjunctivae normal.  Cardiovascular:     Rate and Rhythm: Normal rate and regular rhythm.  Pulmonary:     Effort: Pulmonary effort  is normal.     Breath sounds: Normal breath sounds. No wheezing, rhonchi or rales.  Abdominal:     General: There is no distension.     Palpations: Abdomen is soft.     Tenderness: There is no abdominal tenderness.  Neurological:     Mental Status: She is alert.  Psychiatric:        Mood and Affect: Mood normal.        Behavior: Behavior normal.     Lab Results  Component Value Date   WBC 7.0 10/22/2020   HGB 14.8 10/22/2020   HCT 44.3 10/22/2020   PLT 347 10/22/2020   GLUCOSE 88 10/22/2020   CHOL 207 (H) 10/22/2020   TRIG 136 10/22/2020   HDL 59 10/22/2020   LDLCALC 124 (H) 10/22/2020   ALT 19 10/22/2020   AST 29 10/22/2020   NA 141 10/22/2020   K 4.1 10/22/2020   CL 103 10/22/2020   CREATININE 1.09 (H) 10/22/2020   BUN 9 10/22/2020   CO2 21 10/22/2020   TSH 4.250 10/22/2020     Assessment & Plan:   Problem List Items Addressed This Visit       Endocrine   Hypothyroidism   Relevant Orders   TSH      Other   Physical exam, annual - Primary    Patient is doing well.  Blood pressure is elevated here today.  Was elevated on repeat as well.  This is out of the norm for the patient.  She will check her blood pressures at home.  Labs today include hepatitis C screening.  Flu vaccine given today.  Patient will consider shingles vaccine as well as tetanus at her pharmacy.  She will consider getting COVID booster as well. Colon cancer screening up-to-date.  Mammogram up-to-date.      Hyperlipidemia   Relevant Orders   Lipid panel   Other Visit Diagnoses     Immunization due       Relevant Orders   Flu Vaccine QUAD 51moIM (Fluarix, Fluzone & Alfiuria Quad PF) (Completed)   Elevated serum creatinine       Relevant Orders   CMP14+EGFR   Screening for deficiency anemia       Relevant Orders   CBC   Need for hepatitis C screening test       Relevant Orders   Hepatitis C Antibody       Meds ordered this encounter  Medications   clobetasol cream (TEMOVATE) 0.05 %    Sig: Apply 1 Application topically 2 (two) times daily.    Dispense:  30 g    Refill:  5    Follow-up:  Check BP's at home.  If BP is persistently elevated will need an office visit.  Otherwise follow-up annually.  JMarvin

## 2021-10-26 NOTE — Patient Instructions (Signed)
Labs today.  Consider COVID vaccine as well as the Shingles vaccine.  Follow up annually.  Keep an eye on your BP.  Take care  Dr. Lacinda Axon

## 2021-10-27 ENCOUNTER — Other Ambulatory Visit: Payer: Self-pay | Admitting: Family Medicine

## 2021-10-27 LAB — LIPID PANEL
Chol/HDL Ratio: 3.5 ratio (ref 0.0–4.4)
Cholesterol, Total: 216 mg/dL — ABNORMAL HIGH (ref 100–199)
HDL: 61 mg/dL (ref >39)
LDL Chol Calc (NIH): 128 mg/dL — ABNORMAL HIGH (ref 0–99)
Triglycerides: 153 mg/dL — ABNORMAL HIGH (ref 0–149)
VLDL Cholesterol Cal: 27 mg/dL (ref 5–40)

## 2021-10-27 LAB — CMP14+EGFR
ALT: 19 IU/L (ref 0–32)
AST: 26 IU/L (ref 0–40)
Albumin/Globulin Ratio: 1.7 (ref 1.2–2.2)
Albumin: 4.8 g/dL (ref 3.8–4.9)
Alkaline Phosphatase: 125 IU/L — ABNORMAL HIGH (ref 44–121)
BUN/Creatinine Ratio: 11 (ref 9–23)
BUN: 12 mg/dL (ref 6–24)
Bilirubin Total: 0.5 mg/dL (ref 0.0–1.2)
CO2: 25 mmol/L (ref 20–29)
Calcium: 10.4 mg/dL — ABNORMAL HIGH (ref 8.7–10.2)
Chloride: 101 mmol/L (ref 96–106)
Creatinine, Ser: 1.13 mg/dL — ABNORMAL HIGH (ref 0.57–1.00)
Globulin, Total: 2.9 g/dL (ref 1.5–4.5)
Glucose: 92 mg/dL (ref 70–99)
Potassium: 4.4 mmol/L (ref 3.5–5.2)
Sodium: 142 mmol/L (ref 134–144)
Total Protein: 7.7 g/dL (ref 6.0–8.5)
eGFR: 57 mL/min/{1.73_m2} — ABNORMAL LOW (ref >59)

## 2021-10-27 LAB — CBC
Hematocrit: 44 % (ref 34.0–46.6)
Hemoglobin: 14.5 g/dL (ref 11.1–15.9)
MCH: 30 pg (ref 26.6–33.0)
MCHC: 33 g/dL (ref 31.5–35.7)
MCV: 91 fL (ref 79–97)
Platelets: 312 10*3/uL (ref 150–450)
RBC: 4.83 x10E6/uL (ref 3.77–5.28)
RDW: 13.3 % (ref 11.7–15.4)
WBC: 5.9 10*3/uL (ref 3.4–10.8)

## 2021-10-27 LAB — TSH: TSH: 6.17 u[IU]/mL — ABNORMAL HIGH (ref 0.450–4.500)

## 2021-10-27 LAB — HEPATITIS C ANTIBODY: Hep C Virus Ab: NONREACTIVE

## 2021-10-27 MED ORDER — LEVOTHYROXINE SODIUM 75 MCG PO TABS: 75.0000 ug | ORAL_TABLET | Freq: Every day | ORAL | 0 refills | Status: DC

## 2021-10-28 ENCOUNTER — Other Ambulatory Visit: Payer: Self-pay | Admitting: Family Medicine

## 2021-10-28 DIAGNOSIS — R7989 Other specified abnormal findings of blood chemistry: Secondary | ICD-10-CM

## 2021-10-28 DIAGNOSIS — R748 Abnormal levels of other serum enzymes: Secondary | ICD-10-CM

## 2021-11-10 DIAGNOSIS — R7989 Other specified abnormal findings of blood chemistry: Secondary | ICD-10-CM | POA: Diagnosis not present

## 2021-11-10 DIAGNOSIS — R748 Abnormal levels of other serum enzymes: Secondary | ICD-10-CM | POA: Diagnosis not present

## 2021-11-12 LAB — PTH, INTACT AND CALCIUM
Calcium: 9.4 mg/dL (ref 8.7–10.2)
PTH: 66 pg/mL — ABNORMAL HIGH (ref 15–65)

## 2021-11-12 LAB — CALCIUM, IONIZED: Calcium, Ion: 5.1 mg/dL (ref 4.5–5.6)

## 2021-11-12 LAB — MICROALBUMIN / CREATININE URINE RATIO
Creatinine, Urine: 57.1 mg/dL
Microalb/Creat Ratio: 29 mg/g creat (ref 0–29)
Microalbumin, Urine: 16.6 ug/mL

## 2021-11-12 LAB — GAMMA GT: GGT: 15 IU/L (ref 0–60)

## 2021-12-15 ENCOUNTER — Other Ambulatory Visit (HOSPITAL_COMMUNITY): Payer: Self-pay | Admitting: Family Medicine

## 2021-12-15 DIAGNOSIS — Z1231 Encounter for screening mammogram for malignant neoplasm of breast: Secondary | ICD-10-CM

## 2021-12-20 ENCOUNTER — Ambulatory Visit (HOSPITAL_COMMUNITY)
Admission: RE | Admit: 2021-12-20 | Discharge: 2021-12-20 | Disposition: A | Discharge status: Home / Self Care | Payer: BC Managed Care – PPO | Source: Ambulatory Visit | Attending: Family Medicine | Admitting: Family Medicine

## 2021-12-20 DIAGNOSIS — Z1231 Encounter for screening mammogram for malignant neoplasm of breast: Secondary | ICD-10-CM | POA: Diagnosis not present

## 2022-01-22 ENCOUNTER — Other Ambulatory Visit: Payer: Self-pay | Admitting: Family Medicine

## 2022-04-23 ENCOUNTER — Other Ambulatory Visit: Payer: Self-pay | Admitting: Family Medicine

## 2022-10-18 ENCOUNTER — Other Ambulatory Visit: Payer: Self-pay | Admitting: Family Medicine

## 2022-10-27 ENCOUNTER — Telehealth: Payer: Self-pay | Admitting: Nurse Practitioner

## 2022-10-27 DIAGNOSIS — E785 Hyperlipidemia, unspecified: Secondary | ICD-10-CM

## 2022-10-27 DIAGNOSIS — E039 Hypothyroidism, unspecified: Secondary | ICD-10-CM

## 2022-10-27 DIAGNOSIS — Z130 Encounter for screening for diseases of the blood and blood-forming organs and certain disorders involving the immune mechanism: Secondary | ICD-10-CM

## 2022-10-27 NOTE — Telephone Encounter (Signed)
Patient has physical 12/6 and requesting labs.

## 2022-11-22 ENCOUNTER — Telehealth: Payer: Self-pay | Admitting: *Deleted

## 2022-11-22 ENCOUNTER — Other Ambulatory Visit (HOSPITAL_COMMUNITY): Payer: Self-pay | Admitting: Family Medicine

## 2022-11-22 DIAGNOSIS — Z1231 Encounter for screening mammogram for malignant neoplasm of breast: Secondary | ICD-10-CM

## 2022-11-22 NOTE — Telephone Encounter (Signed)
Source  Lisa Potter (Patient)   Subject  Lisa Potter (Patient)   Topic  General - Other    Communication  Reason for CRM: Pt wants lab request to be sent to [facility]. PT call back # [phone]

## 2022-11-23 ENCOUNTER — Other Ambulatory Visit: Payer: Self-pay | Admitting: Nurse Practitioner

## 2022-11-23 DIAGNOSIS — Z13228 Encounter for screening for other metabolic disorders: Secondary | ICD-10-CM

## 2022-11-23 DIAGNOSIS — Z130 Encounter for screening for diseases of the blood and blood-forming organs and certain disorders involving the immune mechanism: Secondary | ICD-10-CM

## 2022-11-23 DIAGNOSIS — E785 Hyperlipidemia, unspecified: Secondary | ICD-10-CM

## 2022-11-23 DIAGNOSIS — E039 Hypothyroidism, unspecified: Secondary | ICD-10-CM

## 2022-11-23 NOTE — Telephone Encounter (Signed)
Patient notified and verbalized understanding. 

## 2022-11-23 NOTE — Telephone Encounter (Signed)
Campbell Riches, NP     Let patient know I switched all her labs to [facility]. Thanks, Eber Jones

## 2022-11-25 LAB — CBC WITH DIFFERENTIAL/PLATELET
Absolute Lymphocytes: 1707 {cells}/uL (ref 850–3900)
Absolute Monocytes: 655 {cells}/uL (ref 200–950)
Basophils Absolute: 38 {cells}/uL (ref 0–200)
Basophils Relative: 0.6 %
Eosinophils Absolute: 101 {cells}/uL (ref 15–500)
Eosinophils Relative: 1.6 %
HCT: 41.6 % (ref 35.0–45.0)
Hemoglobin: 13.7 g/dL (ref 11.7–15.5)
MCH: 29.5 pg (ref 27.0–33.0)
MCHC: 32.9 g/dL (ref 32.0–36.0)
MCV: 89.5 fL (ref 80.0–100.0)
MPV: 10.3 fL (ref 7.5–12.5)
Monocytes Relative: 10.4 %
Neutro Abs: 3799 {cells}/uL (ref 1500–7800)
Neutrophils Relative %: 60.3 %
Platelets: 307 10*3/uL (ref 140–400)
RBC: 4.65 10*6/uL (ref 3.80–5.10)
RDW: 12.9 % (ref 11.0–15.0)
Total Lymphocyte: 27.1 %
WBC: 6.3 10*3/uL (ref 3.8–10.8)

## 2022-11-25 LAB — LIPID PANEL
Cholesterol: 209 mg/dL — ABNORMAL HIGH (ref <200)
HDL: 59 mg/dL (ref >=50)
LDL Cholesterol (Calc): 127 mg/dL — ABNORMAL HIGH
Non-HDL Cholesterol (Calc): 150 mg/dL — ABNORMAL HIGH (ref <130)
Total CHOL/HDL Ratio: 3.5 (calc) (ref <5.0)
Triglycerides: 120 mg/dL (ref <150)

## 2022-11-25 LAB — COMPREHENSIVE METABOLIC PANEL
AG Ratio: 1.3 (calc) (ref 1.0–2.5)
ALT: 16 U/L (ref 6–29)
AST: 20 U/L (ref 10–35)
Albumin: 4.7 g/dL (ref 3.6–5.1)
Alkaline phosphatase (APISO): 109 U/L (ref 37–153)
BUN: 10 mg/dL (ref 7–25)
CO2: 27 mmol/L (ref 20–32)
Calcium: 10.1 mg/dL (ref 8.6–10.4)
Chloride: 104 mmol/L (ref 98–110)
Creat: 0.92 mg/dL (ref 0.50–1.03)
Globulin: 3.5 g/dL (ref 1.9–3.7)
Glucose, Bld: 79 mg/dL (ref 65–99)
Potassium: 3.6 mmol/L (ref 3.5–5.3)
Sodium: 139 mmol/L (ref 135–146)
Total Bilirubin: 0.7 mg/dL (ref 0.2–1.2)
Total Protein: 8.2 g/dL — ABNORMAL HIGH (ref 6.1–8.1)

## 2022-11-25 LAB — T4, FREE: Free T4: 1.6 ng/dL (ref 0.8–1.8)

## 2022-11-25 LAB — TSH: TSH: 0.08 m[IU]/L — ABNORMAL LOW (ref 0.40–4.50)

## 2022-12-09 ENCOUNTER — Ambulatory Visit (INDEPENDENT_AMBULATORY_CARE_PROVIDER_SITE_OTHER): Payer: BC Managed Care – PPO | Admitting: Nurse Practitioner

## 2022-12-09 ENCOUNTER — Encounter: Payer: Self-pay | Admitting: Nurse Practitioner

## 2022-12-09 VITALS — BP 145/80 | HR 72 | Temp 98.1°F | Ht 62.0 in | Wt 176.0 lb

## 2022-12-09 DIAGNOSIS — Z124 Encounter for screening for malignant neoplasm of cervix: Secondary | ICD-10-CM | POA: Diagnosis not present

## 2022-12-09 DIAGNOSIS — Z01411 Encounter for gynecological examination (general) (routine) with abnormal findings: Secondary | ICD-10-CM

## 2022-12-09 DIAGNOSIS — Z1151 Encounter for screening for human papillomavirus (HPV): Secondary | ICD-10-CM | POA: Diagnosis not present

## 2022-12-09 DIAGNOSIS — E785 Hyperlipidemia, unspecified: Secondary | ICD-10-CM | POA: Diagnosis not present

## 2022-12-09 DIAGNOSIS — Z01419 Encounter for gynecological examination (general) (routine) without abnormal findings: Secondary | ICD-10-CM | POA: Diagnosis not present

## 2022-12-09 DIAGNOSIS — R030 Elevated blood-pressure reading, without diagnosis of hypertension: Secondary | ICD-10-CM

## 2022-12-09 DIAGNOSIS — Z1211 Encounter for screening for malignant neoplasm of colon: Secondary | ICD-10-CM

## 2022-12-09 MED ORDER — CLOBETASOL PROPIONATE 0.05 % EX CREA: 1.0000 | TOPICAL_CREAM | Freq: Two times a day (BID) | CUTANEOUS | 2 refills | Status: DC

## 2022-12-09 MED ORDER — LEVOTHYROXINE SODIUM 75 MCG PO TABS: ORAL_TABLET | ORAL | 3 refills | Status: DC

## 2022-12-09 NOTE — Patient Instructions (Signed)
Tdap vaccine

## 2022-12-10 ENCOUNTER — Encounter: Payer: Self-pay | Admitting: Nurse Practitioner

## 2022-12-10 DIAGNOSIS — R030 Elevated blood-pressure reading, without diagnosis of hypertension: Secondary | ICD-10-CM | POA: Insufficient documentation

## 2022-12-10 NOTE — Progress Notes (Signed)
Subjective:    Patient ID: Lisa Potter, female    DOB: [birth year], 57 y.o.   MRN: [id number]  HPI The patient comes in today for a wellness visit.    A review of their health history was completed.  A review of medications was also completed.  Any needed refills; thyroid med and steroid cream; uses steroid cream on occasion for psoriasis  Eating habits: healthy overall; has reduced salt in her diet  Regular exercise: regular walking  Specialist pt sees on regular basis: None  Preventative health issues were discussed.   Additional concerns: BP outside the office 130s/70-80 Same female sexual partner for 30 years; no vaginal bleeding or pelvic pain Had a negative Cologuard test in 2022. Could not get colonoscopy at that time due to insurance. Has a twin sister who has had colon cancer. Reports personal history of colon polyps. Last colonoscopy 2017 which was normal.  Mammogram is scheduled. Regular dental and vision exams.     Review of Systems  Constitutional:  Negative for activity change, appetite change and fatigue.  HENT:  Negative for sore throat and trouble swallowing.   Respiratory:  Negative for cough, chest tightness, shortness of breath and wheezing.   Cardiovascular:  Negative for chest pain.  Gastrointestinal:  Negative for abdominal distention, abdominal pain, constipation, diarrhea, nausea and vomiting.  Genitourinary:  Negative for difficulty urinating, dysuria, enuresis, frequency, genital sores, pelvic pain, urgency, vaginal bleeding and vaginal discharge.      12/09/2022    2:11 PM  Depression screen PHQ 2/9  Decreased Interest 0  Down, Depressed, Hopeless 0  PHQ - 2 Score 0  Altered sleeping 0  Tired, decreased energy 0  Change in appetite 0  Feeling bad or failure about yourself  0  Trouble concentrating 0  Moving slowly or fidgety/restless 0  Suicidal thoughts 0  PHQ-9 Score 0  Difficult doing work/chores Not difficult at all      12/09/2022     2:11 PM  GAD 7 : Generalized Anxiety Score  Nervous, Anxious, on Edge 0  Control/stop worrying 0  Worry too much - different things 0  Trouble relaxing 0  Restless 0  Easily annoyed or irritable 0  Afraid - awful might happen 0  Total GAD 7 Score 0  Anxiety Difficulty Not difficult at all         Objective:   Physical Exam Vitals and nursing note reviewed. Chaperone present: Defers chaperone.  Constitutional:      General: She is not in acute distress.    Appearance: She is well-developed.  Neck:     Thyroid: No thyromegaly.     Trachea: No tracheal deviation.     Comments: Thyroid non tender to palpation. No mass or goiter noted.  Cardiovascular:     Rate and Rhythm: Normal rate and regular rhythm.     Heart sounds: Normal heart sounds. No murmur heard. Pulmonary:     Effort: Pulmonary effort is normal.     Breath sounds: Normal breath sounds.  Chest:  Breasts:    Right: No swelling, inverted nipple, mass, skin change or tenderness.     Left: No swelling, inverted nipple, mass, skin change or tenderness.  Abdominal:     General: There is no distension.     Palpations: Abdomen is soft.     Tenderness: There is no abdominal tenderness.  Genitourinary:    General: Normal vulva.     Exam position: Lithotomy position.  Labia:        Right: No rash, tenderness or lesion.        Left: No rash, tenderness or lesion.      Vagina: No vaginal discharge, erythema, bleeding or lesions.     Cervix: No cervical motion tenderness, friability, lesion or erythema.     Comments: Bimanual exam: no tenderness or obvious masses. Musculoskeletal:     Cervical back: Normal range of motion and neck supple.  Lymphadenopathy:     Cervical: No cervical adenopathy.     Upper Body:     Right upper body: No supraclavicular, axillary or pectoral adenopathy.     Left upper body: No supraclavicular, axillary or pectoral adenopathy.  Skin:    General: Skin is warm and dry.      Findings: No rash.  Neurological:     Mental Status: She is alert and oriented to person, place, and time.  Psychiatric:        Mood and Affect: Mood normal.        Behavior: Behavior normal.        Thought Content: Thought content normal.        Judgment: Judgment normal.    Results for orders placed or performed in visit on 11/23/22  CBC with Differential/Platelet  Result Value Ref Range   WBC 6.3 3.8 - 10.8 Thousand/uL   RBC 4.65 3.80 - 5.10 Million/uL   Hemoglobin 13.7 11.7 - 15.5 g/dL   HCT 40.9 81.1 - 91.4 %   MCV 89.5 80.0 - 100.0 fL   MCH 29.5 27.0 - 33.0 pg   MCHC 32.9 32.0 - 36.0 g/dL   RDW 78.2 95.6 - 21.3 %   Platelets 307 140 - 400 Thousand/uL   MPV 10.3 7.5 - 12.5 fL   Neutro Abs 3,799 1,500 - 7,800 cells/uL   Absolute Lymphocytes 1,707 850 - 3,900 cells/uL   Absolute Monocytes 655 200 - 950 cells/uL   Eosinophils Absolute 101 15 - 500 cells/uL   Basophils Absolute 38 0 - 200 cells/uL   Neutrophils Relative % 60.3 %   Total Lymphocyte 27.1 %   Monocytes Relative 10.4 %   Eosinophils Relative 1.6 %   Basophils Relative 0.6 %  Comprehensive metabolic panel  Result Value Ref Range   Glucose, Bld 79 65 - 99 mg/dL   BUN 10 7 - 25 mg/dL   Creat 0.86 5.78 - 4.69 mg/dL   BUN/Creatinine Ratio SEE NOTE: 6 - 22 (calc)   Sodium 139 135 - 146 mmol/L   Potassium 3.6 3.5 - 5.3 mmol/L   Chloride 104 98 - 110 mmol/L   CO2 27 20 - 32 mmol/L   Calcium 10.1 8.6 - 10.4 mg/dL   Total Protein 8.2 (H) 6.1 - 8.1 g/dL   Albumin 4.7 3.6 - 5.1 g/dL   Globulin 3.5 1.9 - 3.7 g/dL (calc)   AG Ratio 1.3 1.0 - 2.5 (calc)   Total Bilirubin 0.7 0.2 - 1.2 mg/dL   Alkaline phosphatase (APISO) 109 37 - 153 U/L   AST 20 10 - 35 U/L   ALT 16 6 - 29 U/L  T4, free  Result Value Ref Range   Free T4 1.6 0.8 - 1.8 ng/dL  TSH  Result Value Ref Range   TSH 0.08 (L) 0.40 - 4.50 mIU/L  Lipid panel  Result Value Ref Range   Cholesterol 209 (H) <200 mg/dL   HDL 59 > OR = 50 mg/dL    Triglycerides 629 <528  mg/dL   LDL Cholesterol (Calc) 127 (H) mg/dL (calc)   Total CHOL/HDL Ratio 3.5 <5.0 (calc)   Non-HDL Cholesterol (Calc) 150 (H) <130 mg/dL (calc)   Reviewed labs with patient during visit.  Previous TSH before dose change: 6.1 The 10-year ASCVD risk score (Arnett DK, et al., 2019) is: 5.2%   Values used to calculate the score:     Age: 16 years     Sex: Female     Is Non-Hispanic African American: Yes     Diabetic: No     Tobacco smoker: No     Systolic Blood Pressure: 145 mmHg     Is BP treated: No     HDL Cholesterol: 59 mg/dL     Total Cholesterol: 209 mg/dL       Assessment & Plan:   Problem List Items Addressed This Visit       Other   Elevated BP without diagnosis of hypertension   Hyperlipidemia   Other Visit Diagnoses     Well woman exam    -  Primary   Relevant Orders   IGP, Aptima HPV   Screening for cervical cancer       Relevant Orders   IGP, Aptima HPV   Screening for HPV (human papillomavirus)       Relevant Orders   IGP, Aptima HPV   Screen for colon cancer       Relevant Orders   Ambulatory referral to Gastroenterology      PAP smear pending.  Referral to GI for colonoscopy next year instead of Cologuard due to high risk status. Discussed importance of healthy diet, exercise, low sodium and limited saturated fats.  Patient will continue to monitor BP outside of the office and contact us if consistently above 140/90.  Refills on regular meds.  Meds ordered this encounter  Medications   clobetasol cream (TEMOVATE) 0.05 %    Sig: Apply 1 Application topically 2 (two) times daily.    Dispense:  30 g    Refill:  2    Order Specific Question:   Supervising Provider    Answer:   Lilyan Punt A [[id number]]   levothyroxine (SYNTHROID) 75 MCG tablet    Sig: TAKE 1 TABLET(75 MCG) BY MOUTH DAILY    Dispense:  90 tablet    Refill:  3    Order Specific Question:   Supervising Provider    Answer:   Babs Sciara [[id number]]   Return  in about 1 year (around 12/09/2023) for physical.

## 2022-12-14 ENCOUNTER — Encounter: Payer: Self-pay | Admitting: *Deleted

## 2022-12-16 LAB — IGP, APTIMA HPV: HPV Aptima: NEGATIVE

## 2022-12-26 ENCOUNTER — Ambulatory Visit (HOSPITAL_COMMUNITY): Payer: BC Managed Care – PPO

## 2022-12-26 ENCOUNTER — Ambulatory Visit (HOSPITAL_COMMUNITY)
Admission: RE | Admit: 2022-12-26 | Discharge: 2022-12-26 | Disposition: A | Discharge status: Home / Self Care | Payer: BC Managed Care – PPO | Source: Ambulatory Visit | Attending: Family Medicine | Admitting: Family Medicine

## 2022-12-26 DIAGNOSIS — Z1231 Encounter for screening mammogram for malignant neoplasm of breast: Secondary | ICD-10-CM | POA: Insufficient documentation

## 2023-03-23 ENCOUNTER — Telehealth: Payer: Self-pay | Admitting: *Deleted

## 2023-03-23 NOTE — Telephone Encounter (Signed)
  Procedure: COLONOSCOPY  Estimated body mass index is 32.19 kg/m as calculated from the following:   Height as of 12/09/22: 5\' 2"  (1.575 m).   Weight as of 12/09/22: 176 lb (79.8 kg).   Have you had a colonoscopy before?  10/07/15, Dr. Page Spiro   Do you have family history of colon cancer?  Yes twin sister  Do you have a family history of polyps? yes  Previous colonoscopy with polyps removed? yes  Do you have a history colorectal cancer?   no  Are you diabetic?  no  Do you have a prosthetic or mechanical heart valve? no  Do you have a pacemaker/defibrillator?   no  Have you had endocarditis/atrial fibrillation?  no  Do you use supplemental oxygen/CPAP?  no  Have you had joint replacement within the last 12 months?  no  Do you tend to be constipated or have to use laxatives?  no   Do you have history of alcohol use? If yes, how much and how often.  no  Do you have history or are you using drugs? If yes, what do are you  using?  no  Have you ever had a stroke/heart attack?    Have you ever had a heart or other vascular stent placed,?  Do you take weight loss medication? no  female patients,: have you had a hysterectomy?                               are you post menopausal?  yes                              do you still have your menstrual cycle? no    Date of last menstrual period? 2005  Do you take any blood-thinning medications such as: (Plavix, aspirin, Coumadin, Aggrenox, Brilinta, Xarelto, Eliquis, Pradaxa, Savaysa or Effient)? no  If yes we need the name, milligram, dosage and who is prescribing doctor:               Current Outpatient Medications  Medication Sig Dispense Refill   BLACK COHOSH EXTRACT PO Take by mouth.     Calcium Carbonate (CALCI-CHEW PO) Take by mouth.     clobetasol cream (TEMOVATE) 0.05 % Apply 1 Application topically 2 (two) times daily. 30 g 2   levothyroxine (SYNTHROID) 75 MCG tablet TAKE 1 TABLET(75 MCG) BY MOUTH DAILY 90 tablet 3    OVER THE COUNTER MEDICATION Vit d 2,000 iu     UNABLE TO FIND Med Name: Haskel Schroeder 1000mg      No current facility-administered medications for this visit.    No Known Allergies

## 2023-05-05 NOTE — Telephone Encounter (Signed)
Ok to schedule. ASA 2.  

## 2023-05-08 MED ORDER — PEG 3350-KCL-NA BICARB-NACL 420 G PO SOLR: 4000.0000 mL | Freq: Once | ORAL | 0 refills | Status: AC

## 2023-05-08 NOTE — Addendum Note (Signed)
 Addended by: Feliz Hosteller on: 05/08/2023 08:37 AM   Modules accepted: Orders

## 2023-05-08 NOTE — Telephone Encounter (Signed)
Checked carelon and no PA required

## 2023-05-08 NOTE — Telephone Encounter (Addendum)
 Spoke with pt. She has been scheduled with Dr. Riley Cheadle 5/15. Pt stated she had access to her mychart to get instructions since she would not receive them in time in the mail. Rx for prep sent to pharmacy

## 2023-05-09 ENCOUNTER — Encounter (INDEPENDENT_AMBULATORY_CARE_PROVIDER_SITE_OTHER): Payer: Self-pay | Admitting: *Deleted

## 2023-05-09 NOTE — Telephone Encounter (Signed)
 Referral completed, [facility] apt letter sent to PCP

## 2023-05-11 ENCOUNTER — Encounter (HOSPITAL_COMMUNITY): Payer: Self-pay

## 2023-05-18 ENCOUNTER — Ambulatory Visit (HOSPITAL_COMMUNITY)
Admission: RE | Admit: 2023-05-18 | Discharge: 2023-05-18 | Disposition: A | Discharge status: Home / Self Care | Attending: Internal Medicine | Admitting: Internal Medicine

## 2023-05-18 ENCOUNTER — Encounter (HOSPITAL_COMMUNITY)
Admission: RE | Disposition: A | Discharge status: Home / Self Care | Payer: Self-pay | Source: Home / Self Care | Attending: Internal Medicine

## 2023-05-18 ENCOUNTER — Ambulatory Visit (HOSPITAL_COMMUNITY): Admitting: Anesthesiology

## 2023-05-18 ENCOUNTER — Encounter (HOSPITAL_COMMUNITY): Payer: Self-pay | Admitting: Internal Medicine

## 2023-05-18 ENCOUNTER — Other Ambulatory Visit: Payer: Self-pay

## 2023-05-18 DIAGNOSIS — Z139 Encounter for screening, unspecified: Secondary | ICD-10-CM | POA: Diagnosis not present

## 2023-05-18 DIAGNOSIS — E039 Hypothyroidism, unspecified: Secondary | ICD-10-CM | POA: Diagnosis not present

## 2023-05-18 DIAGNOSIS — K640 First degree hemorrhoids: Secondary | ICD-10-CM | POA: Insufficient documentation

## 2023-05-18 DIAGNOSIS — D124 Benign neoplasm of descending colon: Secondary | ICD-10-CM | POA: Insufficient documentation

## 2023-05-18 DIAGNOSIS — Z8249 Family history of ischemic heart disease and other diseases of the circulatory system: Secondary | ICD-10-CM | POA: Insufficient documentation

## 2023-05-18 DIAGNOSIS — K5730 Diverticulosis of large intestine without perforation or abscess without bleeding: Secondary | ICD-10-CM | POA: Insufficient documentation

## 2023-05-18 DIAGNOSIS — I10 Essential (primary) hypertension: Secondary | ICD-10-CM | POA: Insufficient documentation

## 2023-05-18 DIAGNOSIS — Z1211 Encounter for screening for malignant neoplasm of colon: Secondary | ICD-10-CM | POA: Diagnosis not present

## 2023-05-18 DIAGNOSIS — Z800 Family history of malignant neoplasm of digestive organs: Secondary | ICD-10-CM | POA: Diagnosis not present

## 2023-05-18 DIAGNOSIS — Z8349 Family history of other endocrine, nutritional and metabolic diseases: Secondary | ICD-10-CM | POA: Insufficient documentation

## 2023-05-18 DIAGNOSIS — Z860100 Personal history of colon polyps, unspecified: Secondary | ICD-10-CM | POA: Diagnosis not present

## 2023-05-18 DIAGNOSIS — K635 Polyp of colon: Secondary | ICD-10-CM | POA: Diagnosis not present

## 2023-05-18 HISTORY — PX: COLONOSCOPY: SHX5424

## 2023-05-18 SURGERY — COLONOSCOPY
Anesthesia: General

## 2023-05-18 MED ORDER — LACTATED RINGERS IV SOLN: INTRAVENOUS | Status: DC

## 2023-05-18 MED ORDER — PROPOFOL 10 MG/ML IV BOLUS
INTRAVENOUS | Status: DC | PRN
  Administered 2023-05-18: 50 mg via INTRAVENOUS
  Administered 2023-05-18: 100 mg via INTRAVENOUS

## 2023-05-18 MED ORDER — LIDOCAINE 2% (20 MG/ML) 5 ML SYRINGE
INTRAMUSCULAR | Status: DC | PRN
  Administered 2023-05-18: 100 mg via INTRAVENOUS

## 2023-05-18 MED ORDER — PROPOFOL 500 MG/50ML IV EMUL
INTRAVENOUS | Status: DC | PRN
Start: 2023-05-18 — End: 2023-05-18
  Administered 2023-05-18: 150 ug/kg/min via INTRAVENOUS

## 2023-05-18 NOTE — Transfer of Care (Signed)
 Immediate Anesthesia Transfer of Care Note  Patient: Lisa Potter  Procedure(s) Performed: COLONOSCOPY  Patient Location: Endoscopy Unit  Anesthesia Type:General  Level of Consciousness: drowsy  Airway & Oxygen Therapy: Patient Spontanous Breathing  Post-op Assessment: Report given to RN and Post -op Vital signs reviewed and stable  Post vital signs: Reviewed and stable  Last Vitals:  Vitals Value Taken Time  BP    Temp    Pulse    Resp    SpO2      Last Pain:  Vitals:   05/18/23 0944  TempSrc:   PainSc: 0-No pain      Patients Stated Pain Goal: 4 (05/18/23 0901)  Complications: No notable events documented.

## 2023-05-18 NOTE — Discharge Instructions (Addendum)
  Colonoscopy Discharge Instructions  Read the instructions outlined below and refer to this sheet in the next few weeks. These discharge instructions provide you with general information on caring for yourself after you leave the hospital. Your doctor may also give you specific instructions. While your treatment has been planned according to the most current medical practices available, unavoidable complications occasionally occur. If you have any problems or questions after discharge, call Dr. Riley Cheadle at [phone]. ACTIVITY You may resume your regular activity, but move at a slower pace for the next 24 hours.  Take frequent rest periods for the next 24 hours.  Walking will help get rid of the air and reduce the bloated feeling in your belly (abdomen).  No driving for 24 hours (because of the medicine (anesthesia) used during the test).   Do not sign any important legal documents or operate any machinery for 24 hours (because of the anesthesia used during the test).  NUTRITION Drink plenty of fluids.  You may resume your normal diet as instructed by your doctor.  Begin with a light meal and progress to your normal diet. Heavy or fried foods are harder to digest and may make you feel sick to your stomach (nauseated).  Avoid alcoholic beverages for 24 hours or as instructed.  MEDICATIONS You may resume your normal medications unless your doctor tells you otherwise.  WHAT YOU CAN EXPECT TODAY Some feelings of bloating in the abdomen.  Passage of more gas than usual.  Spotting of blood in your stool or on the toilet paper.  IF YOU HAD POLYPS REMOVED DURING THE COLONOSCOPY: No aspirin products for 7 days or as instructed.  No alcohol for 7 days or as instructed.  Eat a soft diet for the next 24 hours.  FINDING OUT THE RESULTS OF YOUR TEST Not all test results are available during your visit. If your test results are not back during the visit, make an appointment with your caregiver to find out the  results. Do not assume everything is normal if you have not heard from your caregiver or the medical facility. It is important for you to follow up on all of your test results.  SEEK IMMEDIATE MEDICAL ATTENTION IF: You have more than a spotting of blood in your stool.  Your belly is swollen (abdominal distention).  You are nauseated or vomiting.  You have a temperature over 101.  You have abdominal pain or discomfort that is severe or gets worse throughout the day.       1 polyp found and removed   colon polyp and diverticulosis information provided    further recommendations to follow pending review of pathology report

## 2023-05-18 NOTE — Anesthesia Preprocedure Evaluation (Signed)
 Anesthesia Evaluation  Patient identified by MRN, date of birth, ID band Patient awake    Reviewed: Allergy & Precautions, H&P , NPO status , Patient's Chart, lab work & pertinent test results, reviewed documented beta blocker date and time   Airway Mallampati: II  TM Distance: >3 FB Neck ROM: full    Dental no notable dental hx.    Pulmonary neg pulmonary ROS   Pulmonary exam normal breath sounds clear to auscultation       Cardiovascular Exercise Tolerance: Good hypertension, negative cardio ROS  Rhythm:regular Rate:Normal     Neuro/Psych  Headaches negative neurological ROS  negative psych ROS   GI/Hepatic negative GI ROS, Neg liver ROS,,,  Endo/Other  negative endocrine ROSHypothyroidism    Renal/GU negative Renal ROS  negative genitourinary   Musculoskeletal   Abdominal   Peds  Hematology negative hematology ROS (+)   Anesthesia Other Findings   Reproductive/Obstetrics negative OB ROS                             Anesthesia Physical Anesthesia Plan  ASA: 2  Anesthesia Plan: General   Post-op Pain Management:    Induction:   PONV Risk Score and Plan: Propofol infusion  Airway Management Planned:   Additional Equipment:   Intra-op Plan:   Post-operative Plan:   Informed Consent: I have reviewed the patients History and Physical, chart, labs and discussed the procedure including the risks, benefits and alternatives for the proposed anesthesia with the patient or authorized representative who has indicated his/her understanding and acceptance.     Dental Advisory Given  Plan Discussed with: CRNA  Anesthesia Plan Comments:        Anesthesia Quick Evaluation

## 2023-05-18 NOTE — Anesthesia Procedure Notes (Signed)
 Date/Time: 05/18/2023 9:43 AM  Performed by: Sherwin Donate, CRNAPre-anesthesia Checklist: Patient identified, Emergency Drugs available, Suction available and Patient being monitored Patient Re-evaluated:Patient Re-evaluated prior to induction Oxygen Delivery Method: Nasal cannula Induction Type: IV induction Placement Confirmation: positive ETCO2

## 2023-05-18 NOTE — Op Note (Signed)
 [facility] Patient Name: Lisa Potter Procedure Date: 05/18/2023 9:23 AM MRN: [id number] Date of Birth: 06/18/1965 Attending MD: Gemma Kelp , MD, [id number] CSN: [id number] Age: 58 Admit Type: Outpatient Procedure:                Colonoscopy Indications:              High risk colon cancer surveillance: Personal                            history of colonic polyps Providers:                Gemma Kelp, MD, Crystal Page, [place]                            Wilson, Technician, Theola Fitch Referring MD:              Medicines:                Propofol per Anesthesia Complications:            No immediate complications. Estimated Blood Loss:     Estimated blood loss was minimal. Procedure:                Pre-Anesthesia Assessment:                           - Prior to the procedure, a History and Physical                            was performed, and patient medications and                            allergies were reviewed. The patient's tolerance of                            previous anesthesia was also reviewed. The risks                            and benefits of the procedure and the sedation                            options and risks were discussed with the patient.                            All questions were answered, and informed consent                            was obtained. Prior Anticoagulants: The patient has                            taken no anticoagulant or antiplatelet agents. ASA                            Grade Assessment: II - A patient with mild systemic  disease. After reviewing the risks and benefits,                            the patient was deemed in satisfactory condition to                            undergo the procedure.                           After obtaining informed consent, the colonoscope                            was passed under direct vision. Throughout the                            procedure,  the patient's blood pressure, pulse, and                            oxygen saturations were monitored continuously. The                            [id number]) scope was introduced through the                            anus and advanced to the the cecum, identified by                            appendiceal orifice and ileocecal valve. The                            colonoscopy was performed without difficulty. The                            patient tolerated the procedure well. The quality                            of the bowel preparation was adequate. The                            ileocecal valve, appendiceal orifice, and rectum                            were photographed. Scope In: 9:47:46 AM Scope Out: 10:01:33 AM Scope Withdrawal Time: 0 hours 10 minutes 14 seconds  Total Procedure Duration: 0 hours 13 minutes 47 seconds  Findings:      The perianal and digital rectal examinations were normal.      A 4 mm polyp was found in the descending colon. The polyp was removed       with a cold snare. Resection and retrieval were complete. Estimated       blood loss was minimal.      Scattered small-mouthed diverticula were found in the entire colon.      Non-bleeding internal hemorrhoids were found during retroflexion. The       hemorrhoids were mild, small and Grade I (internal hemorrhoids  that do       not prolapse). Impression:               - One 4 mm polyp in the descending colon, removed                            with a cold snare. Resected and retrieved.                           - Diverticulosis in the entire examined colon.                           - Non-bleeding internal hemorrhoids. Moderate Sedation:      Moderate (conscious) sedation was personally administered by an       anesthesia professional. The following parameters were monitored: oxygen       saturation, heart rate, blood pressure, and response to care. Recommendation:           - Repeat colonoscopy date to  be determined after                            pending pathology results are reviewed for                            surveillance.                           - Return to GI office (date not yet determined).                           - Patient has a contact number available for                            emergencies. The signs and symptoms of potential                            delayed complications were discussed with the                            patient. Return to normal activities tomorrow.                            Written discharge instructions were provided to the                            patient.                           - Advance diet as tolerated.                           - Continue present medications. Procedure Code(s):        --- Professional ---                           [phone], Colonoscopy, flexible; with removal of  tumor(s), polyp(s), or other lesion(s) by snare                            technique Diagnosis Code(s):        --- Professional ---                           Z86.010, Personal history of colonic polyps                           D12.4, Benign neoplasm of descending colon                           K64.0, First degree hemorrhoids                           K57.30, Diverticulosis of large intestine without                            perforation or abscess without bleeding CPT copyright 2022 American Medical Association. All rights reserved. The codes documented in this report are preliminary and upon coder review may  be revised to meet current compliance requirements. Windsor Hatcher. Sparkles Mcneely, MD Gemma Kelp, MD 05/18/2023 10:07:49 AM This report has been signed electronically. Number of Addenda: 0

## 2023-05-18 NOTE — H&P (Signed)
 @LOGO @   Primary Care Physician:  Cook, Jayce G, DO Primary Gastroenterologist:  Dr. Riley Cheadle  Pre-Procedure History & Physical: HPI:  Lisa Potter is a 58 y.o. female here for A surveillance colonoscopy.  History of polyps removed in the past  and a positive history of colon cancer in her twin sister.  Last colonoscopy 2017 without polyps.  Surveillance overdue.  Past Medical History:  Diagnosis Date   Headache(784.0)    Hypothyroidism    Vitamin D  deficiency    Vitiligo     Past Surgical History:  Procedure Laterality Date   COLONOSCOPY  10/06/2010   Procedure: COLONOSCOPY;  Surgeon: Ruby Corporal, MD;  Location: [facility];  Service: Endoscopy;  Laterality: N/A;   COLONOSCOPY N/A 10/07/2015   Procedure: COLONOSCOPY;  Surgeon: Ruby Corporal, MD;  Location: [facility];  Service: Endoscopy;  Laterality: N/A;  100   DILATION AND CURETTAGE OF UTERUS      Prior to Admission medications   Medication Sig Start Date End Date Taking? Authorizing Provider  BLACK COHOSH EXTRACT PO Take by mouth.   Yes [provider]  Calcium Carbonate (CALCI-CHEW PO) Take by mouth.   Yes [provider]  levothyroxine  (SYNTHROID ) 75 MCG tablet TAKE 1 TABLET(75 MCG) BY MOUTH DAILY 12/09/22  Yes Derenda Flax, NP  OVER THE COUNTER MEDICATION Vit d 2,000 iu   Yes [provider]  UNABLE TO FIND Med Name: [place] 1000mg    Yes [provider]  clobetasol  cream (TEMOVATE ) 0.05 % Apply 1 Application topically 2 (two) times daily. 12/09/22   Derenda Flax, NP    Allergies as of 05/08/2023   (No Known Allergies)    Family History  Problem Relation Age of Onset   Colon cancer Sister    Hypothyroidism Sister    Hypertension Mother    Diabetes Mother     Social History   Socioeconomic History   Marital status: Married    Spouse name: Not on file   Number of children: Not on file   Years of education: Not on file   Highest education level: Not on  file  Occupational History   Not on file  Tobacco Use   Smoking status: Never   Smokeless tobacco: Never  Vaping Use   Vaping status: Never Used  Substance and Sexual Activity   Alcohol use: No   Drug use: No   Sexual activity: Yes  Other Topics Concern   Not on file  Social History Narrative   Not on file   Social Drivers of Health   Financial Resource Strain: Not on file  Food Insecurity: Not on file  Transportation Needs: Not on file  Physical Activity: Not on file  Stress: Not on file  Social Connections: Not on file  Intimate Partner Violence: Not on file    Review of Systems: See HPI, otherwise negative ROS  Physical Exam: BP (!) 140/68   Pulse 62   Temp 97.9 F (36.6 C) (Oral)   Resp 14   Ht 5\' 3"  (1.6 m)   Wt 79.4 kg   LMP 09/05/2013   SpO2 100%   BMI 31.00 kg/m  General:   Alert,  Well-developed, well-nourished, pleasant and cooperative in NAD Neck:  Supple; no masses or thyromegaly. No significant cervical adenopathy. Lungs:  Clear throughout to auscultation.   No wheezes, crackles, or rhonchi. No acute distress. Heart:  Regular rate and rhythm; no murmurs, clicks, rubs,  or gallops.  Abdomen: Non-distended, normal bowel sounds.  Soft and nontender without appreciable mass or hepatosplenomegaly.   Impression/Plan:    58 year old lady here for surveillance colonoscopy.  Personal history colonic polyps and a positive family history colon cancer in twin sister.   I have offered the patient a surveillance colonoscopy today.The risks, benefits, limitations, alternatives and imponderables have been reviewed with the patient. Questions have been answered. All parties are agreeable.       Notice: This dictation was prepared with Dragon dictation along with smaller phrase technology. Any transcriptional errors that result from this process are unintentional and may not be corrected upon review.

## 2023-05-19 ENCOUNTER — Ambulatory Visit: Payer: Self-pay | Admitting: Internal Medicine

## 2023-05-19 LAB — SURGICAL PATHOLOGY

## 2023-05-23 ENCOUNTER — Encounter (HOSPITAL_COMMUNITY): Payer: Self-pay | Admitting: Internal Medicine

## 2023-06-04 NOTE — Anesthesia Postprocedure Evaluation (Signed)
 Anesthesia Post Note  Patient: Lisa Potter  Procedure(s) Performed: COLONOSCOPY  Patient location during evaluation: Phase II Anesthesia Type: General Level of consciousness: awake Pain management: pain level controlled Vital Signs Assessment: post-procedure vital signs reviewed and stable Respiratory status: spontaneous breathing and respiratory function stable Cardiovascular status: blood pressure returned to baseline and stable Postop Assessment: no headache and no apparent nausea or vomiting Anesthetic complications: no Comments: Late entry   No notable events documented.   Last Vitals:  Vitals:   05/18/23 0901 05/18/23 1004  BP: (!) 140/68 (!) 98/50  Pulse: 62 64  Resp: 14 15  Temp: 36.6 C 36.5 C  SpO2: 100% 98%    Last Pain:  Vitals:   05/18/23 1006  TempSrc:   PainSc: 0-No pain                 Coretha Dew

## 2023-12-11 ENCOUNTER — Other Ambulatory Visit: Payer: Self-pay | Admitting: Nurse Practitioner

## 2023-12-12 ENCOUNTER — Encounter: Admitting: Family Medicine

## 2023-12-15 ENCOUNTER — Other Ambulatory Visit (HOSPITAL_COMMUNITY): Payer: Self-pay | Admitting: Family Medicine

## 2023-12-15 DIAGNOSIS — Z1231 Encounter for screening mammogram for malignant neoplasm of breast: Secondary | ICD-10-CM

## 2023-12-25 ENCOUNTER — Encounter: Admitting: Family Medicine

## 2023-12-25 ENCOUNTER — Encounter: Payer: Self-pay | Admitting: Family Medicine

## 2023-12-25 VITALS — BP 149/69 | HR 72 | Temp 97.3°F | Ht 63.0 in | Wt 177.6 lb

## 2023-12-25 DIAGNOSIS — E785 Hyperlipidemia, unspecified: Secondary | ICD-10-CM

## 2023-12-25 DIAGNOSIS — Z130 Encounter for screening for diseases of the blood and blood-forming organs and certain disorders involving the immune mechanism: Secondary | ICD-10-CM

## 2023-12-25 DIAGNOSIS — Z23 Encounter for immunization: Secondary | ICD-10-CM

## 2023-12-25 DIAGNOSIS — E039 Hypothyroidism, unspecified: Secondary | ICD-10-CM | POA: Diagnosis not present

## 2023-12-25 DIAGNOSIS — L409 Psoriasis, unspecified: Secondary | ICD-10-CM | POA: Diagnosis not present

## 2023-12-25 DIAGNOSIS — Z0000 Encounter for general adult medical examination without abnormal findings: Secondary | ICD-10-CM

## 2023-12-25 DIAGNOSIS — R030 Elevated blood-pressure reading, without diagnosis of hypertension: Secondary | ICD-10-CM

## 2023-12-25 MED ORDER — LEVOTHYROXINE SODIUM 75 MCG PO TABS: ORAL_TABLET | ORAL | 3 refills | Status: DC

## 2023-12-25 MED ORDER — CLOBETASOL PROPIONATE 0.05 % EX CREA: 1.0000 | TOPICAL_CREAM | Freq: Two times a day (BID) | CUTANEOUS | 2 refills | Status: AC

## 2023-12-25 NOTE — Assessment & Plan Note (Signed)
 Doing well.  Preventative health care reviewed and updated.  Pneumococcal vaccine given today.  Cancer screenings up-to-date.

## 2023-12-25 NOTE — Addendum Note (Signed)
 Addended by: CORINNA BRISKER R on: 12/25/2023 11:44 AM   Modules accepted: Orders

## 2023-12-25 NOTE — Patient Instructions (Signed)
 Monitor BP at home.  Meds refilled.  Follow up annually.  Take care  Dr. Bluford

## 2023-12-25 NOTE — Progress Notes (Addendum)
 "  Subjective:  Patient ID: Lisa Potter, female    DOB: Jul 24, 1965  Age: 58 y.o. MRN: [id number]  CC:   Chief Complaint  Patient presents with   Annual Exam    CPE    HPI:  58 year old female presents for annual exam.  Patient is overall doing well.  Needs refills on her medications.  BP is elevated here today.  She states that she only feels anxious when she is here.  Needs labs and pneumococcal vaccine.  We have today.  Mammogram up-to-date.  Denies chest pain or shortness of breath.  No vision changes.  No hearing difficulty.  No abdominal pain.  Patient Active Problem List   Diagnosis Date Noted   Elevated BP without diagnosis of hypertension 12/10/2022   Physical exam, annual 10/21/2020   Hyperlipidemia 11/03/2019   Hypothyroidism 07/18/2012   Psoriasis 07/18/2012    Social Hx   Social History   Socioeconomic History   Marital status: Married    Spouse name: Not on file   Number of children: Not on file   Years of education: Not on file   Highest education level: Some college, no degree  Occupational History   Not on file  Tobacco Use   Smoking status: Never   Smokeless tobacco: Never  Vaping Use   Vaping status: Never Used  Substance and Sexual Activity   Alcohol use: Never   Drug use: Never   Sexual activity: Yes    Birth control/protection: Post-menopausal  Other Topics Concern   Not on file  Social History Narrative   Not on file   Social Drivers of Health   Tobacco Use: Low Risk (12/25/2023)   Patient History    Smoking Tobacco Use: Never    Smokeless Tobacco Use: Never    Passive Exposure: Not on file  Financial Resource Strain: Low Risk (12/24/2023)   Overall Financial Resource Strain (CARDIA)    Difficulty of Paying Living Expenses: Not very hard  Food Insecurity: No Food Insecurity (12/24/2023)   Epic    Worried About Radiation Protection Practitioner of Food in the Last Year: Never true    Ran Out of Food in the Last Year: Never true  Transportation Needs:  No Transportation Needs (12/24/2023)   Epic    Lack of Transportation (Medical): No    Lack of Transportation (Non-Medical): No  Physical Activity: Inactive (12/24/2023)   Exercise Vital Sign    Days of Exercise per Week: 0 days    Minutes of Exercise per Session: Not on file  Stress: No Stress Concern Present (12/24/2023)   [employer] of Occupational Health - Occupational Stress Questionnaire    Feeling of Stress: Not at all  Social Connections: Moderately Integrated (12/24/2023)   Social Connection and Isolation Panel    Frequency of Communication with Friends and Family: Once a week    Frequency of Social Gatherings with Friends and Family: Once a week    Attends Religious Services: More than 4 times per year    Active Member of Clubs or Organizations: Yes    Attends Banker Meetings: 1 to 4 times per year    Marital Status: Married  Depression (PHQ2-9): Low Risk (12/25/2023)   Depression (PHQ2-9)    PHQ-2 Score: 0  Alcohol Screen: Not on file  Housing: Low Risk (12/24/2023)   Epic    Unable to Pay for Housing in the Last Year: No    Number of Times Moved in the Last Year: 0  Homeless in the Last Year: No  Utilities: Not on file  Health Literacy: Not on file    Review of Systems Per HPI  Objective:  BP (!) 149/69   Pulse 72   Temp (!) 97.3 F (36.3 C)   Ht 5' 3 (1.6 m)   Wt 177 lb 9.6 oz (80.6 kg)   LMP 09/05/2013   SpO2 100%   BMI 31.46 kg/m      12/25/2023    9:03 AM 12/25/2023    8:53 AM 05/18/2023   10:04 AM  BP/Weight  Systolic BP 149 147 98  Diastolic BP 69 77 50  Wt. (Lbs)  177.6   BMI  31.46 kg/m2     Physical Exam Vitals and nursing note reviewed.  Constitutional:      General: She is not in acute distress.    Appearance: Normal appearance.  HENT:     Head: Normocephalic and atraumatic.  Eyes:     General:        Right eye: No discharge.        Left eye: No discharge.     Conjunctiva/sclera: Conjunctivae normal.   Cardiovascular:     Rate and Rhythm: Normal rate and regular rhythm.  Pulmonary:     Effort: Pulmonary effort is normal.     Breath sounds: Normal breath sounds. No wheezing, rhonchi or rales.  Neurological:     Mental Status: She is alert.  Psychiatric:        Mood and Affect: Mood normal.        Behavior: Behavior normal.     Lab Results  Component Value Date   WBC 6.3 11/24/2022   HGB 13.7 11/24/2022   HCT 41.6 11/24/2022   PLT 307 11/24/2022   GLUCOSE 79 11/24/2022   CHOL 209 (H) 11/24/2022   TRIG 120 11/24/2022   HDL 59 11/24/2022   LDLCALC 127 (H) 11/24/2022   ALT 16 11/24/2022   AST 20 11/24/2022   NA 139 11/24/2022   K 3.6 11/24/2022   CL 104 11/24/2022   CREATININE 0.92 11/24/2022   BUN 10 11/24/2022   CO2 27 11/24/2022   TSH 0.08 (L) 11/24/2022     Assessment & Plan:  Physical exam, annual Assessment & Plan: Doing well.  Preventative health care reviewed and updated.  Pneumococcal vaccine given today.  Cancer screenings up-to-date.   Elevated BP without diagnosis of hypertension -     Comprehensive metabolic panel with GFR  Hyperlipidemia, unspecified hyperlipidemia type -     Lipid panel  Hypothyroidism, unspecified type -     Levothyroxine  Sodium; TAKE 1 TABLET(75 MCG) BY MOUTH DAILY  Dispense: 90 tablet; Refill: 3 -     TSH  Screening for deficiency anemia -     CBC  Psoriasis -     Clobetasol  Propionate; Apply 1 Application topically 2 (two) times daily.  Dispense: 30 g; Refill: 2  Need for vaccination -     Pneumococcal conjugate vaccine 20-valent    Follow-up: Annually  Jacqulyn Ahle DO [facility] "

## 2023-12-26 ENCOUNTER — Ambulatory Visit: Payer: Self-pay | Admitting: Family Medicine

## 2023-12-26 ENCOUNTER — Other Ambulatory Visit: Payer: Self-pay | Admitting: Family Medicine

## 2023-12-26 DIAGNOSIS — E039 Hypothyroidism, unspecified: Secondary | ICD-10-CM

## 2023-12-26 LAB — COMPREHENSIVE METABOLIC PANEL WITH GFR
AG Ratio: 1.5 (calc) (ref 1.0–2.5)
ALT: 20 U/L (ref 6–29)
AST: 24 U/L (ref 10–35)
Albumin: 4.5 g/dL (ref 3.6–5.1)
Alkaline phosphatase (APISO): 108 U/L (ref 37–153)
BUN: 10 mg/dL (ref 7–25)
CO2: 27 mmol/L (ref 20–32)
Calcium: 9.4 mg/dL (ref 8.6–10.4)
Chloride: 106 mmol/L (ref 98–110)
Creat: 0.96 mg/dL (ref 0.50–1.03)
Globulin: 3.1 g/dL (ref 1.9–3.7)
Glucose, Bld: 88 mg/dL (ref 65–99)
Potassium: 3.9 mmol/L (ref 3.5–5.3)
Sodium: 142 mmol/L (ref 135–146)
Total Bilirubin: 0.5 mg/dL (ref 0.2–1.2)
Total Protein: 7.6 g/dL (ref 6.1–8.1)
eGFR: 69 mL/min/1.73m2

## 2023-12-26 LAB — LIPID PANEL
Cholesterol: 184 mg/dL
HDL: 58 mg/dL
LDL Cholesterol (Calc): 109 mg/dL — ABNORMAL HIGH
Non-HDL Cholesterol (Calc): 126 mg/dL
Total CHOL/HDL Ratio: 3.2 (calc)
Triglycerides: 79 mg/dL

## 2023-12-26 LAB — CBC
HCT: 41.5 % (ref 35.9–46.0)
Hemoglobin: 13.6 g/dL (ref 11.7–15.5)
MCH: 30 pg (ref 27.0–33.0)
MCHC: 32.8 g/dL (ref 31.6–35.4)
MCV: 91.4 fL (ref 81.4–101.7)
MPV: 10.7 fL (ref 7.5–12.5)
Platelets: 349 Thousand/uL (ref 140–400)
RBC: 4.54 Million/uL (ref 3.80–5.10)
RDW: 13.1 % (ref 11.0–15.0)
WBC: 6.6 Thousand/uL (ref 3.8–10.8)

## 2023-12-26 LAB — TSH: TSH: 0.02 m[IU]/L — ABNORMAL LOW (ref 0.40–4.50)

## 2023-12-26 MED ORDER — LEVOTHYROXINE SODIUM 50 MCG PO TABS: ORAL_TABLET | ORAL | 0 refills | Status: AC

## 2023-12-29 ENCOUNTER — Encounter (HOSPITAL_COMMUNITY): Payer: Self-pay

## 2023-12-29 ENCOUNTER — Ambulatory Visit (HOSPITAL_COMMUNITY)
Admission: RE | Admit: 2023-12-29 | Discharge: 2023-12-29 | Disposition: A | Discharge status: Home / Self Care | Source: Ambulatory Visit | Attending: Family Medicine | Admitting: Family Medicine

## 2023-12-29 DIAGNOSIS — Z1231 Encounter for screening mammogram for malignant neoplasm of breast: Secondary | ICD-10-CM | POA: Diagnosis not present
